# Patient Record
Sex: Male | Born: 1970 | Race: Black or African American | Hispanic: No | Smoking: Current every day smoker
Health system: Southern US, Community
[De-identification: ages and names within clinical notes are randomized; demographics above are authoritative.]

## PROBLEM LIST (undated history)

## (undated) DIAGNOSIS — I10 Essential (primary) hypertension: Secondary | ICD-10-CM

## (undated) DIAGNOSIS — Z8489 Family history of other specified conditions: Secondary | ICD-10-CM

## (undated) DIAGNOSIS — K219 Gastro-esophageal reflux disease without esophagitis: Secondary | ICD-10-CM

## (undated) HISTORY — PX: APPENDECTOMY: SHX54

---

## 2019-05-25 ENCOUNTER — Emergency Department (HOSPITAL_COMMUNITY)
Admission: EM | Admit: 2019-05-25 | Discharge: 2019-05-25 | Disposition: A | Discharge status: Home / Self Care | Attending: Emergency Medicine | Admitting: Emergency Medicine

## 2019-05-25 ENCOUNTER — Emergency Department (HOSPITAL_COMMUNITY)

## 2019-05-25 ENCOUNTER — Other Ambulatory Visit: Payer: Self-pay

## 2019-05-25 ENCOUNTER — Encounter (HOSPITAL_COMMUNITY): Payer: Self-pay | Admitting: Emergency Medicine

## 2019-05-25 DIAGNOSIS — R519 Headache, unspecified: Secondary | ICD-10-CM | POA: Insufficient documentation

## 2019-05-25 DIAGNOSIS — F17210 Nicotine dependence, cigarettes, uncomplicated: Secondary | ICD-10-CM | POA: Diagnosis not present

## 2019-05-25 DIAGNOSIS — I10 Essential (primary) hypertension: Secondary | ICD-10-CM | POA: Diagnosis not present

## 2019-05-25 HISTORY — DX: Essential (primary) hypertension: I10

## 2019-05-25 HISTORY — DX: Gastro-esophageal reflux disease without esophagitis: K21.9

## 2019-05-25 LAB — CBC WITH DIFFERENTIAL/PLATELET
Abs Immature Granulocytes: 0 10*3/uL (ref 0.00–0.07)
Basophils Absolute: 0 10*3/uL (ref 0.0–0.1)
Basophils Relative: 0 %
Eosinophils Absolute: 0.1 10*3/uL (ref 0.0–0.5)
Eosinophils Relative: 2 %
HCT: 42.8 % (ref 39.0–52.0)
Hemoglobin: 13.8 g/dL (ref 13.0–17.0)
Immature Granulocytes: 0 %
Lymphocytes Relative: 52 %
Lymphs Abs: 2.8 10*3/uL (ref 0.7–4.0)
MCH: 27.7 pg (ref 26.0–34.0)
MCHC: 32.2 g/dL (ref 30.0–36.0)
MCV: 85.8 fL (ref 80.0–100.0)
Monocytes Absolute: 0.4 10*3/uL (ref 0.1–1.0)
Monocytes Relative: 8 %
Neutro Abs: 2.1 10*3/uL (ref 1.7–7.7)
Neutrophils Relative %: 38 %
Platelets: 231 10*3/uL (ref 150–400)
RBC: 4.99 MIL/uL (ref 4.22–5.81)
RDW: 14.6 % (ref 11.5–15.5)
WBC: 5.4 10*3/uL (ref 4.0–10.5)
nRBC: 0 % (ref 0.0–0.2)

## 2019-05-25 LAB — TROPONIN I (HIGH SENSITIVITY)
Troponin I (High Sensitivity): 3 ng/L (ref <18)
Troponin I (High Sensitivity): 4 ng/L (ref <18)

## 2019-05-25 LAB — BASIC METABOLIC PANEL
Anion gap: 10 (ref 5–15)
BUN: 17 mg/dL (ref 6–20)
CO2: 27 mmol/L (ref 22–32)
Calcium: 9.1 mg/dL (ref 8.9–10.3)
Chloride: 101 mmol/L (ref 98–111)
Creatinine, Ser: 0.87 mg/dL (ref 0.61–1.24)
GFR calc Af Amer: >60 mL/min (ref >60)
GFR calc non Af Amer: >60 mL/min (ref >60)
Glucose, Bld: 91 mg/dL (ref 70–99)
Potassium: 4 mmol/L (ref 3.5–5.1)
Sodium: 138 mmol/L (ref 135–145)

## 2019-05-25 NOTE — ED Provider Notes (Signed)
Pt signed out to me from [facility], PA-c pending CT imaging and visual acuity.  Pt with chest pain 2 days ago, migrating headache, first at left temple, now at the right occipital, mild currently, transient blurred vision right eye, improved.  Diagnosed with htn since incarceration, on lisinopril, hctz and nifedipine with persistent elevation in bp's.  Nearly sx free here,  Labs, imaging stable, no evidence of cva, delta troponins negative.  No headache pattern suggesting temporal arteritis.  No exam findings to suggest cva.  Visual acuity 2/20 OS/OD/OU  Advised close f/u with primary provider for consideration of bp medication adjustment.    The patient appears reasonably screened and/or stabilized for discharge and I doubt any other medical condition or other [facility] requiring further screening, evaluation, or treatment in the ED at this time prior to discharge.  Results for orders placed or performed during the hospital encounter of 05/25/19  CBC with Differential  Result Value Ref Range   WBC 5.4 4.0 - 10.5 K/uL   RBC 4.99 4.22 - 5.81 MIL/uL   Hemoglobin 13.8 13.0 - 17.0 g/dL   HCT 42.8 39.0 - 52.0 %   MCV 85.8 80.0 - 100.0 fL   MCH 27.7 26.0 - 34.0 pg   MCHC 32.2 30.0 - 36.0 g/dL   RDW 14.6 11.5 - 15.5 %   Platelets 231 150 - 400 K/uL   nRBC 0.0 0.0 - 0.2 %   Neutrophils Relative % 38 %   Neutro Abs 2.1 1.7 - 7.7 K/uL   Lymphocytes Relative 52 %   Lymphs Abs 2.8 0.7 - 4.0 K/uL   Monocytes Relative 8 %   Monocytes Absolute 0.4 0.1 - 1.0 K/uL   Eosinophils Relative 2 %   Eosinophils Absolute 0.1 0.0 - 0.5 K/uL   Basophils Relative 0 %   Basophils Absolute 0.0 0.0 - 0.1 K/uL   Immature Granulocytes 0 %   Abs Immature Granulocytes 0.00 0.00 - 0.07 K/uL  Basic metabolic panel  Result Value Ref Range   Sodium 138 135 - 145 mmol/L   Potassium 4.0 3.5 - 5.1 mmol/L   Chloride 101 98 - 111 mmol/L   CO2 27 22 - 32 mmol/L   Glucose, Bld 91 70 - 99 mg/dL   BUN 17 6 - 20 mg/dL   Creatinine, Ser 0.87 0.61 - 1.24 mg/dL   Calcium 9.1 8.9 - 10.3 mg/dL   GFR calc non Af Amer >60 >60 mL/min   GFR calc Af Amer >60 >60 mL/min   Anion gap 10 5 - 15  Troponin I (High Sensitivity)  Result Value Ref Range   Troponin I (High Sensitivity) 3 <18 ng/L  Troponin I (High Sensitivity)  Result Value Ref Range   Troponin I (High Sensitivity) 4 <18 ng/L   CT HEAD WO CONTRAST  Result Date: 05/25/2019 CLINICAL DATA:  Headache, episode of blurry vision EXAM: CT HEAD WITHOUT CONTRAST TECHNIQUE: Contiguous axial images were obtained from the base of the skull through the vertex without intravenous contrast. COMPARISON:  None. FINDINGS: Brain: There is no acute intracranial hemorrhage, mass-effect, or edema. Gray-white differentiation is preserved. A small focal area of hypoattenuation at the left aspect of the pons is probably artifactual. There is no extra-axial fluid collection. Ventricles and sulci are within normal limits in size and configuration. Vascular: No hyperdense vessel or unexpected calcification. Skull: Calvarium is unremarkable. Sinuses/Orbits: No acute finding. Other: None. IMPRESSION: No acute intracranial hemorrhage, mass effect, or evidence of acute infarction. Electronically Signed  By: Macy Mis M.D.   On: 05/25/2019 16:41   DG Chest Portable 1 View  Result Date: 05/25/2019 CLINICAL DATA:  Chest pain EXAM: PORTABLE CHEST 1 VIEW COMPARISON:  None. FINDINGS: There is no edema or consolidation. Heart is upper normal in size with pulmonary vascularity normal. No adenopathy. No bone lesions. No pneumothorax. IMPRESSION: No edema or consolidation. Heart upper normal in size. No adenopathy evident. Electronically Signed   By: Lowella Grip III M.D.   On: 05/25/2019 16:09       Landis Martins 05/25/19 1807    Noemi Chapel, MD 05/26/19 1021

## 2019-05-25 NOTE — ED Provider Notes (Signed)
[facility] EMERGENCY DEPARTMENT Provider Note   CSN: [id number] Arrival date & time: 05/25/19  1116     History Chief Complaint  Patient presents with  . Headache    Kyle Reed is a 48 y.o. male.  HPI      Kyle Reed is a 48 y.o. male who is an inmate at a local correctional facility,  presents to the Emergency Department complaining of left chest pain two days ago and left sided headache for one day.  He states that he occasionally has chest pain associated with belching that is typically relieved after taking antacids.  This episode did not improve after taking Prilosec.  Yesterday, he reports a gradual onset of throbbing pain to his left temple area and associated with blurred vision in his right eye.  He denies having any facial weakness, changes in speech, numbness or weakness of his extremities and dizziness.  He does endorse drooling and increased salivation for several days.  He reports a known history of hypertension, but states he has not been on any medication for his blood pressure prior to his incarceration.  Since then, he has been taking 25 mg hydrochlorothiazide, 10 mg lisinopril, 20 mg nifedipine.  Patient was also noted to have a blood pressure of 160/108 this morning at 8 AM.  He was given clonidine at the facility and sent to ER for further evaluation.   Past Medical History:  Diagnosis Date  . Acid reflux   . Hypertension     There are no problems to display for this patient.    No family history on file.  Social History   Tobacco Use  . Smoking status: Current Some Day Smoker  Substance Use Topics  . Alcohol use: Not Currently  . Drug use: Not on file    Home Medications Prior to Admission medications   Not on File    Allergies    Patient has no allergy information on record.  Review of Systems   Review of Systems  Constitutional: Negative for activity change, appetite change and fever.  HENT: Positive for drooling. Negative for  facial swelling, sore throat and trouble swallowing.   Eyes: Positive for visual disturbance. Negative for photophobia and pain.  Respiratory: Negative for cough and shortness of breath.   Cardiovascular: Positive for chest pain.  Gastrointestinal: Negative for abdominal pain, nausea and vomiting.  Genitourinary: Negative for dysuria and flank pain.  Musculoskeletal: Negative for neck pain and neck stiffness.  Skin: Negative for rash and wound.  Neurological: Positive for headaches. Negative for dizziness, syncope, facial asymmetry, speech difficulty, weakness and numbness.  Psychiatric/Behavioral: Negative for confusion and decreased concentration.    Physical Exam Updated Vital Signs BP (!) 133/102 (BP Location: Right Arm)   Pulse 79   Temp 97.9 F (36.6 C) (Oral)   Resp 18   Ht 5\' 9"  (1.753 m)   Wt 93 kg   SpO2 98%   BMI 30.27 kg/m   Physical Exam Vitals and nursing note reviewed.  Constitutional:      General: He is not in acute distress.    Appearance: He is well-developed. He is not ill-appearing.  HENT:     Head: Normocephalic.     Mouth/Throat:     Mouth: Mucous membranes are moist.     Pharynx: Oropharynx is clear.  Eyes:     Extraocular Movements: Extraocular movements intact.     Conjunctiva/sclera: Conjunctivae normal.     Pupils: Pupils are equal, round, and reactive  to light.  Neck:     Trachea: Phonation normal.     Meningeal: Kernig's sign absent.  Cardiovascular:     Rate and Rhythm: Normal rate and regular rhythm.     Pulses: Normal pulses.  Pulmonary:     Effort: Pulmonary effort is normal. No respiratory distress.     Breath sounds: Normal breath sounds.  Musculoskeletal:        General: Normal range of motion.     Cervical back: Normal range of motion and neck supple. No rigidity. No spinous process tenderness or muscular tenderness.     Right lower leg: No edema.     Left lower leg: No edema.  Skin:    General: Skin is warm.     Capillary  Refill: Capillary refill takes less than 2 seconds.     Findings: No rash.  Neurological:     General: No focal deficit present.     Mental Status: He is alert and oriented to person, place, and time.     GCS: GCS eye subscore is 4. GCS verbal subscore is 5. GCS motor subscore is 6.     Cranial Nerves: No cranial nerve deficit.     Sensory: Sensation is intact. No sensory deficit.     Motor: Motor function is intact. No abnormal muscle tone.     Coordination: Coordination is intact. Coordination normal.     Gait: Gait is intact. Gait normal.     Comments: CN II-XII intact.  No pronator drift, no dysarthria, no facial droop.  Patient ambulated into the room with steady gait.  Psychiatric:        Thought Content: Thought content normal.     ED Results / Procedures / Treatments   Labs (all labs ordered are listed, but only abnormal results are displayed) Labs Reviewed  CBC WITH DIFFERENTIAL/PLATELET  BASIC METABOLIC PANEL  TROPONIN I (HIGH SENSITIVITY)  TROPONIN I (HIGH SENSITIVITY)    EKG EKG Interpretation  Date/Time:  Tuesday May 25 2019 13:11:00 EST Ventricular Rate:  72 PR Interval:  128 QRS Duration: 92 QT Interval:  384 QTC Calculation: 420 R Axis:   -36 Text Interpretation: Normal sinus rhythm Left axis deviation Abnormal ECG No old tracing to compare Confirmed by Aletta Edouard [phone]) on 05/25/2019 1:19:37 PM   Radiology No results found.  Procedures Procedures (including critical care time)  Medications Ordered in ED Medications - No data to display  ED Course  I have reviewed the triage vital signs and the nursing notes.  Pertinent labs & imaging results that were available during my care of the patient were reviewed by me and considered in my medical decision making (see chart for details).    MDM Rules/Calculators/A&P                      Pt with chest pain and headache of gradual onset.  Sx's have been associated with blurred vision on right  and "drooling"  His neurological exam is reassuring and he ambulatory with steady gait. Doubt CVA/TIA. He is hypertensive here and was treated this morning with clonidine.   Will obtain labs, imaging and EKG.   Work up is pending, discussed with Evalee Jefferson, PA-C at end of shift.     Final Clinical Impression(s) / ED Diagnoses Final diagnoses:  None    Rx / DC Orders ED Discharge Orders    None       Kem Parkinson, PA-C 05/25/19 1654  Noemi Chapel, MD 05/26/19 1022

## 2019-05-25 NOTE — ED Triage Notes (Signed)
Pt reports headache for last several days. Pt reports episode of chest pain x2 days ago, pt reports episode of blurred vision yesterday, and intermittent "drolling" last several days. Pt alert and oriented. Facial symmetry noted. Pt denies numbness/tingling, chest pain at this time.airway patent.

## 2019-05-25 NOTE — Discharge Instructions (Signed)
Your lab tests, CT imaging, ekg and chest xray are all reassuring today for no evidence of stroke or other complications from your elevated blood pressure.

## 2021-06-20 ENCOUNTER — Other Ambulatory Visit (HOSPITAL_COMMUNITY): Payer: Self-pay | Admitting: Physician Assistant

## 2021-06-20 ENCOUNTER — Other Ambulatory Visit: Payer: Self-pay | Admitting: Physician Assistant

## 2021-06-20 DIAGNOSIS — R109 Unspecified abdominal pain: Secondary | ICD-10-CM

## 2021-07-31 ENCOUNTER — Other Ambulatory Visit: Payer: Self-pay

## 2021-07-31 ENCOUNTER — Ambulatory Visit (HOSPITAL_COMMUNITY)
Admission: RE | Admit: 2021-07-31 | Discharge: 2021-07-31 | Disposition: A | Discharge status: Home / Self Care | Payer: 59 | Source: Ambulatory Visit | Attending: Physician Assistant | Admitting: Physician Assistant

## 2021-07-31 DIAGNOSIS — R109 Unspecified abdominal pain: Secondary | ICD-10-CM | POA: Insufficient documentation

## 2021-08-13 ENCOUNTER — Encounter: Payer: Self-pay | Admitting: Internal Medicine

## 2021-08-20 ENCOUNTER — Other Ambulatory Visit: Payer: Self-pay | Admitting: Family Medicine

## 2021-08-20 ENCOUNTER — Other Ambulatory Visit (HOSPITAL_COMMUNITY): Payer: Self-pay | Admitting: Family Medicine

## 2021-08-20 DIAGNOSIS — D7389 Other diseases of spleen: Secondary | ICD-10-CM

## 2021-08-20 DIAGNOSIS — R911 Solitary pulmonary nodule: Secondary | ICD-10-CM

## 2021-08-24 DIAGNOSIS — E559 Vitamin D deficiency, unspecified: Secondary | ICD-10-CM | POA: Diagnosis not present

## 2021-08-24 DIAGNOSIS — R109 Unspecified abdominal pain: Secondary | ICD-10-CM | POA: Diagnosis not present

## 2021-08-24 DIAGNOSIS — R69 Illness, unspecified: Secondary | ICD-10-CM | POA: Diagnosis not present

## 2021-08-24 DIAGNOSIS — I10 Essential (primary) hypertension: Secondary | ICD-10-CM | POA: Diagnosis not present

## 2021-08-24 DIAGNOSIS — R748 Abnormal levels of other serum enzymes: Secondary | ICD-10-CM | POA: Diagnosis not present

## 2021-08-24 DIAGNOSIS — K219 Gastro-esophageal reflux disease without esophagitis: Secondary | ICD-10-CM | POA: Diagnosis not present

## 2021-08-24 DIAGNOSIS — R911 Solitary pulmonary nodule: Secondary | ICD-10-CM | POA: Diagnosis not present

## 2021-08-29 ENCOUNTER — Ambulatory Visit (HOSPITAL_COMMUNITY)
Admission: RE | Admit: 2021-08-29 | Discharge: 2021-08-29 | Disposition: A | Discharge status: Home / Self Care | Payer: 59 | Source: Ambulatory Visit | Attending: Family Medicine | Admitting: Family Medicine

## 2021-08-29 DIAGNOSIS — D7389 Other diseases of spleen: Secondary | ICD-10-CM | POA: Insufficient documentation

## 2021-08-29 DIAGNOSIS — D734 Cyst of spleen: Secondary | ICD-10-CM | POA: Diagnosis not present

## 2021-09-14 ENCOUNTER — Ambulatory Visit (HOSPITAL_COMMUNITY)
Admission: RE | Admit: 2021-09-14 | Discharge: 2021-09-14 | Disposition: A | Discharge status: Home / Self Care | Payer: 59 | Source: Ambulatory Visit | Attending: Family Medicine | Admitting: Family Medicine

## 2021-09-14 DIAGNOSIS — R911 Solitary pulmonary nodule: Secondary | ICD-10-CM | POA: Diagnosis not present

## 2021-09-20 ENCOUNTER — Telehealth: Payer: Self-pay

## 2021-09-20 ENCOUNTER — Ambulatory Visit (INDEPENDENT_AMBULATORY_CARE_PROVIDER_SITE_OTHER): Payer: 59 | Admitting: Internal Medicine

## 2021-09-20 ENCOUNTER — Inpatient Hospital Stay (HOSPITAL_COMMUNITY): Payer: 59 | Admitting: Hematology

## 2021-09-20 ENCOUNTER — Other Ambulatory Visit: Payer: Self-pay

## 2021-09-20 ENCOUNTER — Encounter: Payer: Self-pay | Admitting: Internal Medicine

## 2021-09-20 VITALS — BP 110/70 | HR 84 | Temp 97.0°F | Ht 68.0 in | Wt 207.2 lb

## 2021-09-20 DIAGNOSIS — Z1211 Encounter for screening for malignant neoplasm of colon: Secondary | ICD-10-CM | POA: Diagnosis not present

## 2021-09-20 DIAGNOSIS — D7389 Other diseases of spleen: Secondary | ICD-10-CM | POA: Diagnosis not present

## 2021-09-20 DIAGNOSIS — R1012 Left upper quadrant pain: Secondary | ICD-10-CM | POA: Diagnosis not present

## 2021-09-20 DIAGNOSIS — K5900 Constipation, unspecified: Secondary | ICD-10-CM | POA: Diagnosis not present

## 2021-09-20 DIAGNOSIS — K219 Gastro-esophageal reflux disease without esophagitis: Secondary | ICD-10-CM

## 2021-09-20 DIAGNOSIS — R911 Solitary pulmonary nodule: Secondary | ICD-10-CM | POA: Insufficient documentation

## 2021-09-20 MED ORDER — PEG 3350-KCL-NA BICARB-NACL 420 G PO SOLR: 4000.0000 mL | ORAL | 0 refills | Status: DC

## 2021-09-20 NOTE — Progress Notes (Shared)
? ?[facility] ?[address] ?[place], [place] [address] ? ? ?CLINIC:  ?Medical Oncology/Hematology ? ?CONSULT NOTE ? ?Patient Care Team: ?Bucio, [place] Dragon, FNP as PCP - General (Family Medicine) ?Eloise Harman, DO as Consulting Physician (Gastroenterology) ? ?CHIEF COMPLAINTS/PURPOSE OF CONSULTATION:  ?Evaluation of right middle lobe lung nodule and splenic lesion ? ?HISTORY OF PRESENTING ILLNESS:  ?Mr. Markel Kurtenbach 51 y.o. male is here because of right middle lobe lung nodule, at the request of [facility]. ? ?*** ? ?MEDICAL HISTORY:  ?Past Medical History:  ?Diagnosis Date  ? Acid reflux   ? Hypertension   ? ? ?SURGICAL HISTORY: ?*** The histories are not reviewed yet. Please review them in the "History" navigator section and refresh this [place]. ? ?SOCIAL HISTORY: ?Social History  ? ?Socioeconomic History  ? Marital status: Legally Separated  ?  Spouse name: Not on file  ? Number of children: Not on file  ? Years of education: Not on file  ? Highest education level: Not on file  ?Occupational History  ? Not on file  ?Tobacco Use  ? Smoking status: Some Days  ? Smokeless tobacco: Not on file  ?Substance and Sexual Activity  ? Alcohol use: Not Currently  ? Drug use: Not on file  ? Sexual activity: Not on file  ?Other Topics Concern  ? Not on file  ?Social History Narrative  ? Not on file  ? ?Social Determinants of Health  ? ?Financial Resource Strain: Not on file  ?Food Insecurity: Not on file  ?Transportation Needs: Not on file  ?Physical Activity: Not on file  ?Stress: Not on file  ?Social Connections: Not on file  ?Intimate Partner Violence: Not on file  ? ? ?FAMILY HISTORY: ?No family history on file. ? ?ALLERGIES:  ?has no allergies on file. ? ?MEDICATIONS:  ?No current outpatient medications on file.  ? ?No current facility-administered medications for this visit.  ? ? ?REVIEW OF SYSTEMS:   ?Review of Systems - Oncology ? ? ?PHYSICAL EXAMINATION: ?ECOG PERFORMANCE STATUS: {CHL  ONC ECOG QT:[id number]} ? ?There were no vitals filed for this visit. ?There were no vitals filed for this visit. ?Physical Exam ? ? ?LABORATORY DATA:  ?I have reviewed the data as listed ? ?  Latest Ref Rng & Units 05/25/2019  ?  2:25 PM  ?CBC  ?WBC 4.0 - 10.5 K/uL 5.4    ?Hemoglobin 13.0 - 17.0 g/dL 13.8    ?Hematocrit 39.0 - 52.0 % 42.8    ?Platelets 150 - 400 K/uL 231    ? ? ?  Latest Ref Rng & Units 05/25/2019  ?  2:25 PM  ?CMP  ?Glucose 70 - 99 mg/dL 91    ?BUN 6 - 20 mg/dL 17    ?Creatinine 0.61 - 1.24 mg/dL 0.87    ?Sodium 135 - 145 mmol/L 138    ?Potassium 3.5 - 5.1 mmol/L 4.0    ?Chloride 98 - 111 mmol/L 101    ?CO2 22 - 32 mmol/L 27    ?Calcium 8.9 - 10.3 mg/dL 9.1    ? ? ?RADIOGRAPHIC STUDIES: ?I have personally reviewed the radiological images as listed and agreed with the findings in the report. ?CT CHEST WO CONTRAST ? ?Result Date: 09/17/2021 ?CLINICAL DATA:  Follow-up pulmonary nodule seen on CT of the abdomen pelvis dated 07/31/2021. EXAM: CT CHEST WITHOUT CONTRAST TECHNIQUE: Multidetector CT imaging of the chest was performed following the standard protocol without IV contrast. RADIATION DOSE REDUCTION:  This exam was performed according to the departmental dose-optimization program which includes automated exposure control, adjustment of the mA and/or kV according to patient size and/or use of iterative reconstruction technique. COMPARISON:  CT abdomen pelvis dated 07/31/2021 and chest CT dated 09/24/2020. FINDINGS: Evaluation of this exam is limited in the absence of intravenous contrast. Cardiovascular: There is no cardiomegaly or pericardial effusion. There is coronary vascular calcification involving the left circumflex artery. The thoracic aorta and central pulmonary arteries are grossly unremarkable on this noncontrast CT. Mediastinum/Nodes: No hilar or mediastinal adenopathy. The esophagus and the thyroid gland are grossly unremarkable. No mediastinal fluid collection. Lungs/Pleura: No  significant interval change in the 15 mm subpleural nodule in the right middle lobe dating back to 09/24/2020. No focal consolidation, pleural effusion, or pneumothorax. The central airways are patent. Upper Abdomen: A 3.5 cm hypodense lesion within the spleen has increased in size since 09/24/2020. This is suboptimally characterized but likely represents a cyst or hemangioma. Ultrasound or MRI may provide better characterization. Musculoskeletal: No chest wall mass or suspicious bone lesions identified. IMPRESSION: 1. No acute intrathoracic pathology. 2. No significant interval change in the 15 mm subpleural nodule in the right middle lobe dating back to 09/24/2020. Further evaluation with PET-CT or follow-up with CT in 6-12 months recommended. 3. Coronary vascular calcification involving the left circumflex artery. Electronically Signed   By: Anner Crete M.D.   On: 09/17/2021 14:37  ? ?US SPLEEN (ABDOMEN LIMITED) ? ?Result Date: 08/30/2021 ?CLINICAL DATA:  Hypodense splenic lesion on CT. EXAM: ULTRASOUND ABDOMEN LIMITED COMPARISON:  The CT without contrast 07/31/2021, CTA chest, abdomen and pelvis 09/24/2020 FINDINGS: Targeted ultrasound utilizing an abdominal curved transducer was performed with attention to the spleen only. Spleen measures 7.5 x 10.1 x 4.8 cm for a volume of 189.35 cm 3. There is a thinly septated cystic lesion corresponding to the CT finding, with no visible mural nodularity or internal color flow and with layering hypoechoic debris in the posterior cavity. The cystic lesion measures 4.3 x 3.7 x 4.1 cm, similar in size to the 07/31/2021 exam but has enlarged compared with 09/24/2020, when this measured no more than 1.4 cm in diameter. IMPRESSION: Septated splenic cystic lesion, more than doubled in size since 09/24/2020. No internal color flow or mural nodularity is seen. Clinical significance indeterminate. Electronically Signed   By: Telford Nab M.D.   On: 08/30/2021 00:51    ? ?ASSESSMENT:  ?Right middle lobe lung nodule ? ? ?Splenic lesion ? ? ?Social/family history ? ? ?PLAN:  ?Right middle lobe lung nodule ? ? ?Splenic lesion ? ? ?All questions were answered. The patient knows to call the clinic with any problems, questions or concerns. ? ? ?Derek Jack, MD, 09/20/21 8:04 AM  ?[facility] ?[phone] ? ? ?I, Thana Ates, am acting as a scribe for Dr. Derek Jack. ? ?{Add Scribe Attestation Statement}  ? ? ?

## 2021-09-20 NOTE — Progress Notes (Addendum)
? ? ?Primary Care Physician:  Olga Coaster, FNP ?Primary Gastroenterologist:  Dr. Abbey Chatters ? ?Chief Complaint  ?Patient presents with  ? New Patient (Initial Visit)  ?  TCS and left abd pain  ? ? ?HPI:   ?Kyle Reed is a 51 y.o. male who presents to clinic today by referral from his PCP also Bucio for evaluation.  Multiple GI complaints. ? ?Has chronic GERD which is well controlled on omeprazole 40 mg daily.  Denies any dysphagia/odynophagia, epigastric or chest pain.  No melena hematochezia. ? ?Never undergone colonoscopy before.  Wishes to schedule screening colonoscopy today.  No family history of colorectal malignancy though he does note he is unaware of his father's past medical history.  No unintentional weight loss. ? ?Does note left upper quadrant abdominal pain x1 year.  Constant, mild to moderate in severity, does not radiate.  Started on Flexeril which she states helps some. ? ?CT abdomen pelvis without contrast 07/31/2021 showed 13 mm right middle lobe pulmonary nodule and 2.7 cm l cyst in the spleen.  Follow-up ultrasound of spleen 08/29/2021 showed cystic lesion measuring 4.3 x 3.7 x 4.1 cm in the spleen. ? ?He is seeing Dr. Raliegh Ip next week in regards to these lesions. ? ?Patient also with chronic constipation.  States he has bowel movements 1-2 times daily though these are largely incomplete.  Did have a CT that showed moderate stool burden previously.  No straining.  No rectal bleeding.  No rectal discomfort. ? ?Past Medical History:  ?Diagnosis Date  ? Acid reflux   ? Hypertension   ? ? ?Past Surgical History:  ?Procedure Laterality Date  ? APPENDECTOMY    ? ? ?Current Outpatient Medications  ?Medication Sig Dispense Refill  ? amLODipine (NORVASC) 10 MG tablet Take 10 mg by mouth daily.    ? cyclobenzaprine (FLEXERIL) 10 MG tablet Take 10 mg by mouth 3 (three) times daily as needed for muscle spasms.    ? famotidine (PEPCID) 20 MG tablet Take 20 mg by mouth 2 (two) times daily.    ? hydrOXYzine (ATARAX)  25 MG tablet Take 25 mg by mouth 3 (three) times daily as needed.    ? ibuprofen (ADVIL) 800 MG tablet Take 800 mg by mouth every 8 (eight) hours as needed.    ? losartan-hydrochlorothiazide (HYZAAR) 100-12.5 MG tablet Take 1 tablet by mouth daily.    ? omeprazole (PRILOSEC) 40 MG capsule Take 40 mg by mouth daily.    ? traMADol (ULTRAM) 50 MG tablet Take by mouth every 6 (six) hours as needed.    ? ?No current facility-administered medications for this visit.  ? ? ?Allergies as of 09/20/2021  ? (Not on File)  ? ? ?History reviewed. No pertinent family history. ? ?Social History  ? ?Socioeconomic History  ? Marital status: Legally Separated  ?  Spouse name: Not on file  ? Number of children: Not on file  ? Years of education: Not on file  ? Highest education level: Not on file  ?Occupational History  ? Not on file  ?Tobacco Use  ? Smoking status: Every Day  ?  Packs/day: 0.50  ?  Types: Cigarettes  ? Smokeless tobacco: Not on file  ?Substance and Sexual Activity  ? Alcohol use: Not Currently  ?  Comment: a beer every now and then  ? Drug use: Yes  ?  Types: Marijuana  ? Sexual activity: Not on file  ?Other Topics Concern  ? Not on file  ?Social  History Narrative  ? Not on file  ? ?Social Determinants of Health  ? ?Financial Resource Strain: Not on file  ?Food Insecurity: Not on file  ?Transportation Needs: Not on file  ?Physical Activity: Not on file  ?Stress: Not on file  ?Social Connections: Not on file  ?Intimate Partner Violence: Not on file  ? ? ?Subjective: ?Review of Systems  ?Constitutional:  Negative for chills and fever.  ?HENT:  Negative for congestion and hearing loss.   ?Eyes:  Negative for blurred vision and double vision.  ?Respiratory:  Negative for cough and shortness of breath.   ?Cardiovascular:  Negative for chest pain and palpitations.  ?Gastrointestinal:  Positive for abdominal pain, constipation and heartburn. Negative for blood in stool, diarrhea, melena and vomiting.  ?Genitourinary:   Negative for dysuria and urgency.  ?Musculoskeletal:  Negative for joint pain and myalgias.  ?Skin:  Negative for itching and rash.  ?Neurological:  Negative for dizziness and headaches.  ?Psychiatric/Behavioral:  Negative for depression. The patient is not nervous/anxious.    ? ? ? ?Objective: ?BP 110/70   Pulse 84   Temp (!) 97 ?F (36.1 ?C)   Ht '5\' 8"'$  (1.727 m)   Wt 207 lb 3.2 oz (94 kg)   BMI 31.50 kg/m?  ?Physical Exam ?Constitutional:   ?   Appearance: Normal appearance.  ?HENT:  ?   Head: Normocephalic and atraumatic.  ?Eyes:  ?   Extraocular Movements: Extraocular movements intact.  ?   Conjunctiva/sclera: Conjunctivae normal.  ?Cardiovascular:  ?   Rate and Rhythm: Normal rate and regular rhythm.  ?Pulmonary:  ?   Effort: Pulmonary effort is normal.  ?   Breath sounds: Normal breath sounds.  ?Abdominal:  ?   General: Bowel sounds are normal.  ?   Palpations: Abdomen is soft.  ?   Tenderness: There is abdominal tenderness.  ?Musculoskeletal:     ?   General: Normal range of motion.  ?   Cervical back: Normal range of motion and neck supple.  ?Skin: ?   General: Skin is warm.  ?Neurological:  ?   General: No focal deficit present.  ?   Mental Status: He is alert and oriented to person, place, and time.  ?Psychiatric:     ?   Mood and Affect: Mood normal.     ?   Behavior: Behavior normal.  ? ? ? ?Assessment: ?*Chronic GERD-well-controlled on omeprazole daily ?*Left upper quadrant pain ?*Splenic cyst-enlarging ?*Chronic constipation ?*Colon cancer screening ? ?Plan: ?GERD well-controlled on omeprazole daily.  We will continue.  No alarm features today to warrant further investigation with EGD. ? ?Etiology of left upper quadrant pain unclear, possibly musculoskeletal.  States Flexeril is helping.  Does have an enlarging splenic cyst.  Seeing Dr. Raliegh Ip of oncology next week in regards to this as well as pulmonary nodule. ? ?For patient's constipation, I recommended taking MiraLAX 1 capful daily.  If this is not  adequate then increase to 2 capfuls daily.  If this is still not adequate then add on once daily Dulcolax. ? ?Also recommended patient increase soluble fiber.  Encouraged to drink at least 6 glasses of water daily. ? ?Will schedule for screening colonoscopy.The risks including infection, bleed, or perforation as well as benefits, limitations, alternatives and imponderables have been reviewed with the patient. Questions have been answered. All parties agreeable. ? ?Thank you Leandro Reasoner Bucio for the kind referral ? ? ?09/20/2021 11:02 AM ? ? ?Disclaimer: This note was dictated with voice  recognition software. Similar sounding words can inadvertently be transcribed and may not be corrected upon review. ? ?

## 2021-09-20 NOTE — Telephone Encounter (Signed)
TCS instructions were given to pt this morning at [facility]. ?

## 2021-09-20 NOTE — H&P (View-Only) (Signed)
? ? ?Primary Care Physician:  Olga Coaster, FNP ?Primary Gastroenterologist:  Dr. Abbey Chatters ? ?Chief Complaint  ?Patient presents with  ? New Patient (Initial Visit)  ?  TCS and left abd pain  ? ? ?HPI:   ?Kyle Reed is a 51 y.o. male who presents to clinic today by referral from his PCP also [facility] for evaluation.  Multiple GI complaints. ? ?Has chronic GERD which is well controlled on omeprazole 40 mg daily.  Denies any dysphagia/odynophagia, epigastric or chest pain.  No melena hematochezia. ? ?Never undergone colonoscopy before.  Wishes to schedule screening colonoscopy today.  No family history of colorectal malignancy though he does note he is unaware of his father's past medical history.  No unintentional weight loss. ? ?Does note left upper quadrant abdominal pain x1 year.  Constant, mild to moderate in severity, does not radiate.  Started on Flexeril which she states helps some. ? ?CT abdomen pelvis without contrast 07/31/2021 showed 13 mm right middle lobe pulmonary nodule and 2.7 cm l cyst in the spleen.  Follow-up ultrasound of spleen 08/29/2021 showed cystic lesion measuring 4.3 x 3.7 x 4.1 cm in the spleen. ? ?He is seeing Dr. Raliegh Ip next week in regards to these lesions. ? ?Patient also with chronic constipation.  States he has bowel movements 1-2 times daily though these are largely incomplete.  Did have a CT that showed moderate stool burden previously.  No straining.  No rectal bleeding.  No rectal discomfort. ? ?Past Medical History:  ?Diagnosis Date  ? Acid reflux   ? Hypertension   ? ? ?Past Surgical History:  ?Procedure Laterality Date  ? APPENDECTOMY    ? ? ?Current Outpatient Medications  ?Medication Sig Dispense Refill  ? amLODipine (NORVASC) 10 MG tablet Take 10 mg by mouth daily.    ? cyclobenzaprine (FLEXERIL) 10 MG tablet Take 10 mg by mouth 3 (three) times daily as needed for muscle spasms.    ? famotidine (PEPCID) 20 MG tablet Take 20 mg by mouth 2 (two) times daily.    ? hydrOXYzine (ATARAX)  25 MG tablet Take 25 mg by mouth 3 (three) times daily as needed.    ? ibuprofen (ADVIL) 800 MG tablet Take 800 mg by mouth every 8 (eight) hours as needed.    ? losartan-hydrochlorothiazide (HYZAAR) 100-12.5 MG tablet Take 1 tablet by mouth daily.    ? omeprazole (PRILOSEC) 40 MG capsule Take 40 mg by mouth daily.    ? traMADol (ULTRAM) 50 MG tablet Take by mouth every 6 (six) hours as needed.    ? ?No current facility-administered medications for this visit.  ? ? ?Allergies as of 09/20/2021  ? (Not on File)  ? ? ?History reviewed. No pertinent family history. ? ?Social History  ? ?Socioeconomic History  ? Marital status: Legally Separated  ?  Spouse name: Not on file  ? Number of children: Not on file  ? Years of education: Not on file  ? Highest education level: Not on file  ?Occupational History  ? Not on file  ?Tobacco Use  ? Smoking status: Every Day  ?  Packs/day: 0.50  ?  Types: Cigarettes  ? Smokeless tobacco: Not on file  ?Substance and Sexual Activity  ? Alcohol use: Not Currently  ?  Comment: a beer every now and then  ? Drug use: Yes  ?  Types: Marijuana  ? Sexual activity: Not on file  ?Other Topics Concern  ? Not on file  ?Social  History Narrative  ? Not on file  ? ?Social Determinants of Health  ? ?Financial Resource Strain: Not on file  ?Food Insecurity: Not on file  ?Transportation Needs: Not on file  ?Physical Activity: Not on file  ?Stress: Not on file  ?Social Connections: Not on file  ?Intimate Partner Violence: Not on file  ? ? ?Subjective: ?Review of Systems  ?Constitutional:  Negative for chills and fever.  ?HENT:  Negative for congestion and hearing loss.   ?Eyes:  Negative for blurred vision and double vision.  ?Respiratory:  Negative for cough and shortness of breath.   ?Cardiovascular:  Negative for chest pain and palpitations.  ?Gastrointestinal:  Positive for abdominal pain, constipation and heartburn. Negative for blood in stool, diarrhea, melena and vomiting.  ?Genitourinary:   Negative for dysuria and urgency.  ?Musculoskeletal:  Negative for joint pain and myalgias.  ?Skin:  Negative for itching and rash.  ?Neurological:  Negative for dizziness and headaches.  ?Psychiatric/Behavioral:  Negative for depression. The patient is not nervous/anxious.    ? ? ? ?Objective: ?BP 110/70   Pulse 84   Temp (!) 97 ?F (36.1 ?C)   Ht '5\' 8"'$  (1.727 m)   Wt 207 lb 3.2 oz (94 kg)   BMI 31.50 kg/m?  ?Physical Exam ?Constitutional:   ?   Appearance: Normal appearance.  ?HENT:  ?   Head: Normocephalic and atraumatic.  ?Eyes:  ?   Extraocular Movements: Extraocular movements intact.  ?   Conjunctiva/sclera: Conjunctivae normal.  ?Cardiovascular:  ?   Rate and Rhythm: Normal rate and regular rhythm.  ?Pulmonary:  ?   Effort: Pulmonary effort is normal.  ?   Breath sounds: Normal breath sounds.  ?Abdominal:  ?   General: Bowel sounds are normal.  ?   Palpations: Abdomen is soft.  ?   Tenderness: There is abdominal tenderness.  ?Musculoskeletal:     ?   General: Normal range of motion.  ?   Cervical back: Normal range of motion and neck supple.  ?Skin: ?   General: Skin is warm.  ?Neurological:  ?   General: No focal deficit present.  ?   Mental Status: He is alert and oriented to person, place, and time.  ?Psychiatric:     ?   Mood and Affect: Mood normal.     ?   Behavior: Behavior normal.  ? ? ? ?Assessment: ?*Chronic GERD-well-controlled on omeprazole daily ?*Left upper quadrant pain ?*Splenic cyst-enlarging ?*Chronic constipation ?*Colon cancer screening ? ?Plan: ?GERD well-controlled on omeprazole daily.  We will continue.  No alarm features today to warrant further investigation with EGD. ? ?Etiology of left upper quadrant pain unclear, possibly musculoskeletal.  States Flexeril is helping.  Does have an enlarging splenic cyst.  Seeing Dr. Raliegh Ip of oncology next week in regards to this as well as pulmonary nodule. ? ?For patient's constipation, I recommended taking MiraLAX 1 capful daily.  If this is not  adequate then increase to 2 capfuls daily.  If this is still not adequate then add on once daily Dulcolax. ? ?Also recommended patient increase soluble fiber.  Encouraged to drink at least 6 glasses of water daily. ? ?Will schedule for screening colonoscopy.The risks including infection, bleed, or perforation as well as benefits, limitations, alternatives and imponderables have been reviewed with the patient. Questions have been answered. All parties agreeable. ? ?Thank you Leandro Reasoner Bucio for the kind referral ? ? ?09/20/2021 11:02 AM ? ? ?Disclaimer: This note was dictated with voice  recognition software. Similar sounding words can inadvertently be transcribed and may not be corrected upon review. ? ?

## 2021-09-20 NOTE — Patient Instructions (Signed)
We will schedule you for colonoscopy for colon cancer screening purposes. ? ?For your constipation, I want you to start taking over the counter MiraLAX 1 capful daily.  If this does not adequately control your constipation, I would increase to 2 capfuls daily.  If this is still not adequate, then I would add on once daily Dulcolax (bisacodyl) tablet. ?  ?I also recommend increasing fiber in your diet or adding OTC Benefiber/Metamucil. Be sure to drink at least 4 to 6 glasses of water daily.  ? ?Agree with seeing Dr. Raliegh Ip next week.  Likely will need repeat imaging of lung nodule and splenic cyst in 6 to 12 months.  I will defer to his expertise in this regard. ? ?It was nice meeting you today. ? ?Dr. Abbey Chatters ? ?At [facility] we value your feedback. You may receive a survey about your visit today. Please share your experience as we strive to create trusting relationships with our patients to provide genuine, compassionate, quality care. ? ?We appreciate your understanding and patience as we review any laboratory studies, imaging, and other diagnostic tests that are ordered as we care for you. Our office policy is 5 business days for review of these results, and any emergent or urgent results are addressed in a timely manner for your best interest. If you do not hear from our office in 1 week, please contact us.  ? ?We also encourage the use of MyChart, which contains your medical information for your review as well. If you are not enrolled in this feature, an access code is on this after visit summary for your convenience. Thank you for allowing [place] to be involved in your care. ? ?It was great to see you today!  I hope you have a great rest of your Spring! ? ? ? ?Kyle Reed. Abbey Chatters, D.O. ?Gastroenterology and Hepatology ?[facility] ? ?

## 2021-09-20 NOTE — Telephone Encounter (Signed)
Pre-op appt 09/27/21 at 1:15pm. Tried to call pt, LMOVM to inform him of pre-op appt. Letter mailed also. ?

## 2021-09-26 ENCOUNTER — Inpatient Hospital Stay (HOSPITAL_COMMUNITY): Payer: 59

## 2021-09-26 ENCOUNTER — Inpatient Hospital Stay (HOSPITAL_COMMUNITY): Payer: 59 | Attending: Hematology | Admitting: Hematology

## 2021-09-26 VITALS — BP 145/98 | HR 85 | Temp 98.0°F | Resp 18 | Ht 68.5 in | Wt 210.3 lb

## 2021-09-26 DIAGNOSIS — D7389 Other diseases of spleen: Secondary | ICD-10-CM | POA: Diagnosis not present

## 2021-09-26 DIAGNOSIS — R911 Solitary pulmonary nodule: Secondary | ICD-10-CM | POA: Insufficient documentation

## 2021-09-26 DIAGNOSIS — C7A026 Malignant carcinoid tumor of the rectum: Secondary | ICD-10-CM | POA: Diagnosis not present

## 2021-09-26 DIAGNOSIS — D122 Benign neoplasm of ascending colon: Secondary | ICD-10-CM | POA: Insufficient documentation

## 2021-09-26 DIAGNOSIS — F1290 Cannabis use, unspecified, uncomplicated: Secondary | ICD-10-CM | POA: Diagnosis not present

## 2021-09-26 DIAGNOSIS — Z801 Family history of malignant neoplasm of trachea, bronchus and lung: Secondary | ICD-10-CM | POA: Insufficient documentation

## 2021-09-26 DIAGNOSIS — Z79899 Other long term (current) drug therapy: Secondary | ICD-10-CM | POA: Diagnosis not present

## 2021-09-26 DIAGNOSIS — F17210 Nicotine dependence, cigarettes, uncomplicated: Secondary | ICD-10-CM | POA: Diagnosis not present

## 2021-09-26 LAB — COMPREHENSIVE METABOLIC PANEL
ALT: 15 U/L (ref 0–44)
AST: 15 U/L (ref 15–41)
Albumin: 4.1 g/dL (ref 3.5–5.0)
Alkaline Phosphatase: 91 U/L (ref 38–126)
Anion gap: 8 (ref 5–15)
BUN: 16 mg/dL (ref 6–20)
CO2: 24 mmol/L (ref 22–32)
Calcium: 9.1 mg/dL (ref 8.9–10.3)
Chloride: 105 mmol/L (ref 98–111)
Creatinine, Ser: 0.96 mg/dL (ref 0.61–1.24)
GFR, Estimated: >60 mL/min (ref >60)
Glucose, Bld: 108 mg/dL — ABNORMAL HIGH (ref 70–99)
Potassium: 3.1 mmol/L — ABNORMAL LOW (ref 3.5–5.1)
Sodium: 137 mmol/L (ref 135–145)
Total Bilirubin: 0.7 mg/dL (ref 0.3–1.2)
Total Protein: 7.4 g/dL (ref 6.5–8.1)

## 2021-09-26 LAB — CBC WITH DIFFERENTIAL/PLATELET
Abs Immature Granulocytes: 0.01 10*3/uL (ref 0.00–0.07)
Basophils Absolute: 0 10*3/uL (ref 0.0–0.1)
Basophils Relative: 1 %
Eosinophils Absolute: 0.1 10*3/uL (ref 0.0–0.5)
Eosinophils Relative: 1 %
HCT: 42.7 % (ref 39.0–52.0)
Hemoglobin: 14.7 g/dL (ref 13.0–17.0)
Immature Granulocytes: 0 %
Lymphocytes Relative: 34 %
Lymphs Abs: 1.7 10*3/uL (ref 0.7–4.0)
MCH: 28.7 pg (ref 26.0–34.0)
MCHC: 34.4 g/dL (ref 30.0–36.0)
MCV: 83.4 fL (ref 80.0–100.0)
Monocytes Absolute: 0.5 10*3/uL (ref 0.1–1.0)
Monocytes Relative: 11 %
Neutro Abs: 2.7 10*3/uL (ref 1.7–7.7)
Neutrophils Relative %: 53 %
Platelets: 252 10*3/uL (ref 150–400)
RBC: 5.12 MIL/uL (ref 4.22–5.81)
RDW: 15.5 % (ref 11.5–15.5)
WBC: 5.1 10*3/uL (ref 4.0–10.5)
nRBC: 0 % (ref 0.0–0.2)

## 2021-09-26 LAB — LACTATE DEHYDROGENASE: LDH: 121 U/L (ref 98–192)

## 2021-09-26 NOTE — Patient Instructions (Signed)
? ? ? ? ? ? Kimberly Coye ? 09/26/2021  ?  ? '@PREFPERIOPPHARMACY'$ @ ? ? Your procedure is scheduled on  10/01/2021. ? ? Report to [facility] at  1300   (1:00) P.M. ? ? Call this number if you have problems the morning of surgery: ? [phone] ? ? Remember: ? Follow the diet and prep instructions given to you by the office. ?  ? Take these medicines the morning of surgery with A SIP OF WATER   ? ?    ?   amlodipine, flexeril, pepcid, atarax, prilosec, tramadol. ? ? ?  ? Do not wear jewelry, make-up or nail polish. ? Do not wear lotions, powders, or perfumes, or deodorant. ? Do not shave 48 hours prior to surgery.  Men may shave face and neck. ? Do not bring valuables to the hospital. ? [facility] is not responsible for any belongings or valuables. ? ?Contacts, dentures or bridgework may not be worn into surgery.  Leave your suitcase in the car.  After surgery it may be brought to your room. ? ?For patients admitted to the hospital, discharge time will be determined by your treatment team. ? ?Patients discharged the day of surgery will not be allowed to drive home and must have someone with them for 24 hours.  ? ? ?Special instructions:   DO NOT smoke tobacco or vape for 24 hours before your procedure. ? ?Please read over the following fact sheets that you were given. ?Anesthesia Post-op Instructions and Care and Recovery After Surgery ?  ? ? ? Colonoscopy, Adult, Care After ?The following information offers guidance on how to care for yourself after your procedure. Your health care provider may also give you more specific instructions. If you have problems or questions, contact your health care provider. ?What can I expect after the procedure? ?After the procedure, it is common to have: ?A small amount of blood in your stool for 24 hours after the procedure. ?Some gas. ?Mild cramping or bloating of your abdomen. ?Follow these instructions at home: ?Eating and drinking ? ?Drink enough fluid to keep your urine pale  yellow. ?Follow instructions from your health care provider about eating or drinking restrictions. ?Resume your normal diet as told by your health care provider. Avoid heavy or fried foods that are hard to digest. ?Activity ?Rest as told by your health care provider. ?Avoid sitting for a long time without moving. Get up to take short walks every 1-2 hours. This is important to improve blood flow and breathing. Ask for help if you feel weak or unsteady. ?Return to your normal activities as told by your health care provider. Ask your health care provider what activities are safe for you. ?Managing cramping and bloating ? ?Try walking around when you have cramps or feel bloated. ?If directed, apply heat to your abdomen as told by your health care provider. Use the heat source that your health care provider recommends, such as a moist heat pack or a heating pad. ?Place a towel between your skin and the heat source. ?Leave the heat on for 20-30 minutes. ?Remove the heat if your skin turns bright red. This is especially important if you are unable to feel pain, heat, or cold. You have a greater risk of getting burned. ?General instructions ?If you were given a sedative during the procedure, it can affect you for several hours. Do not drive or operate machinery until your health care provider says that it is safe. ?For the first  24 hours after the procedure: ?Do not sign important documents. ?Do not drink alcohol. ?Do your regular daily activities at a slower pace than normal. ?Eat soft foods that are easy to digest. ?Take over-the-counter and prescription medicines only as told by your health care provider. ?Keep all follow-up visits. This is important. ?Contact a health care provider if: ?You have blood in your stool 2-3 days after the procedure. ?Get help right away if: ?You have more than a small spotting of blood in your stool. ?You have large blood clots in your stool. ?You have swelling of your abdomen. ?You have  nausea or vomiting. ?You have a fever. ?You have increasing pain in your abdomen that is not relieved with medicine. ?These symptoms may be an emergency. Get help right away. Call 911. ?Do not wait to see if the symptoms will go away. ?Do not drive yourself to the hospital. ?Summary ?After the procedure, it is common to have a small amount of blood in your stool. You may also have mild cramping and bloating of your abdomen. ?If you were given a sedative during the procedure, it can affect you for several hours. Do not drive or operate machinery until your health care provider says that it is safe. ?Get help right away if you have a lot of blood in your stool, nausea or vomiting, a fever, or increased pain in your abdomen. ?This information is not intended to replace advice given to you by your health care provider. Make sure you discuss any questions you have with your health care provider. ?Document Revised: 01/03/2021 Document Reviewed: 01/03/2021 ?Elsevier Patient Education ? [place]. ?Monitored Anesthesia Care, Care After ?This sheet gives you information about how to care for yourself after your procedure. Your health care provider may also give you more specific instructions. If you have problems or questions, contact your health care provider. ?What can I expect after the procedure? ?After the procedure, it is common to have: ?Tiredness. ?Forgetfulness about what happened after the procedure. ?Impaired judgment for important decisions. ?Nausea or vomiting. ?Some difficulty with balance. ?Follow these instructions at home: ?For the time period you were told by your health care provider: ? ?  ? ?Rest as needed. ?Do not participate in activities where you could fall or become injured. ?Do not drive or use machinery. ?Do not drink alcohol. ?Do not take sleeping pills or medicines that cause drowsiness. ?Do not make important decisions or sign legal documents. ?Do not take care of children on your  own. ?Eating and drinking ?Follow the diet that is recommended by your health care provider. ?Drink enough fluid to keep your urine pale yellow. ?If you vomit: ?Drink water, juice, or soup when you can drink without vomiting. ?Make sure you have little or no nausea before eating solid foods. ?General instructions ?Have a responsible adult stay with you for the time you are told. It is important to have someone help care for you until you are awake and alert. ?Take over-the-counter and prescription medicines only as told by your health care provider. ?If you have sleep apnea, surgery and certain medicines can increase your risk for breathing problems. Follow instructions from your health care provider about wearing your sleep device: ?Anytime you are sleeping, including during daytime naps. ?While taking prescription pain medicines, sleeping medicines, or medicines that make you drowsy. ?Avoid smoking. ?Keep all follow-up visits as told by your health care provider. This is important. ?Contact a health care provider if: ?You keep feeling nauseous  or you keep vomiting. ?You feel light-headed. ?You are still sleepy or having trouble with balance after 24 hours. ?You develop a rash. ?You have a fever. ?You have redness or swelling around the IV site. ?Get help right away if: ?You have trouble breathing. ?You have new-onset confusion at home. ?Summary ?For several hours after your procedure, you may feel tired. You may also be forgetful and have poor judgment. ?Have a responsible adult stay with you for the time you are told. It is important to have someone help care for you until you are awake and alert. ?Rest as told. Do not drive or operate machinery. Do not drink alcohol or take sleeping pills. ?Get help right away if you have trouble breathing, or if you suddenly become confused. ?This information is not intended to replace advice given to you by your health care provider. Make sure you discuss any questions you  have with your health care provider. ?Document Revised: 04/17/2021 Document Reviewed: 04/15/2019 ?Elsevier Patient Education ? [place]. ? ?

## 2021-09-26 NOTE — Progress Notes (Signed)
? ?[facility] ?[address] ?[place], [place] [address] ? ? ?CLINIC:  ?Medical Oncology/Hematology ? ?CONSULT NOTE ? ?Patient Care Team: ?[place], [place] Dragon, FNP as PCP - General (Family Medicine) ?Eloise Harman, DO as Consulting Physician (Gastroenterology) ?Derek Jack, MD as Medical Oncologist (Medical Oncology) ?Brien Mates, RN as Oncology Nurse Navigator (Medical Oncology) ? ?CHIEF COMPLAINTS/PURPOSE OF CONSULTATION:  ?Evaluation of 13 mm right lung nodule and 4 cm splenic lesion ? ?HISTORY OF PRESENTING ILLNESS:  ?[name] 51 y.o. male is here because of evaluation of 13 mm right lung nodule and 4 cm splenic lesion, at the request of [facility]. ? ?Today he reports feeling fair. He reports severe pain in his left flank radiating from the left side of his abdomen to the left side of his back starting 8 months ago. At the time reports he was diagnosed with sciatica. He reports at the time of diagnosis this radiated down his left leg; the pain has resolved but he reports continued occasional numbness in his left thigh. He denies recent infections, fevers, night sweats, and weight loss. He denies history of CVA and MI. He reports his first colonoscopy is scheduled for 5/8.He denies history of kidney stones. He denies difficulty urinating.  ? ?He is currently unemployed due to his left flank pain, and he previously worked as a Games developer and at [employer]. His father had "throat cancer". He denies family history of lupus anticoagulant. He smokes 1/3 ppd of cigarettes, and he has smoked for 40 years. He also smokes marijuana. He denies family history of sarcoidosis.  ? ?MEDICAL HISTORY:  ?Past Medical History:  ?Diagnosis Date  ? Acid reflux   ? Hypertension   ? ? ?SURGICAL HISTORY: ?Past Surgical History:  ?Procedure Laterality Date  ? APPENDECTOMY    ? ? ?SOCIAL HISTORY: ?Social History  ? ?Socioeconomic History  ? Marital status: Legally Separated  ?   Spouse name: Not on file  ? Number of children: Not on file  ? Years of education: Not on file  ? Highest education level: Not on file  ?Occupational History  ? Not on file  ?Tobacco Use  ? Smoking status: Every Day  ?  Packs/day: 0.50  ?  Types: Cigarettes  ? Smokeless tobacco: Not on file  ?Substance and Sexual Activity  ? Alcohol use: Not Currently  ?  Comment: a beer every now and then  ? Drug use: Yes  ?  Types: Marijuana  ? Sexual activity: Not on file  ?Other Topics Concern  ? Not on file  ?Social History Narrative  ? Not on file  ? ?Social Determinants of Health  ? ?Financial Resource Strain: Not on file  ?Food Insecurity: Not on file  ?Transportation Needs: Not on file  ?Physical Activity: Not on file  ?Stress: Not on file  ?Social Connections: Not on file  ?Intimate Partner Violence: Not on file  ? ? ?FAMILY HISTORY: ?No family history on file. ? ?ALLERGIES:  ?has No Known Allergies. ? ?MEDICATIONS:  ?Current Outpatient Medications  ?Medication Sig Dispense Refill  ? amLODipine (NORVASC) 10 MG tablet Take 10 mg by mouth daily.    ? cyclobenzaprine (FLEXERIL) 10 MG tablet Take 10 mg by mouth 3 (three) times daily as needed for muscle spasms.    ? famotidine (PEPCID) 20 MG tablet Take 20 mg by mouth 2 (two) times daily.    ? hydrOXYzine (ATARAX) 25 MG tablet Take 25 mg by  mouth 3 (three) times daily as needed.    ? ibuprofen (ADVIL) 800 MG tablet Take 800 mg by mouth every 8 (eight) hours as needed.    ? losartan-hydrochlorothiazide (HYZAAR) 100-12.5 MG tablet Take 1 tablet by mouth daily.    ? omeprazole (PRILOSEC) 40 MG capsule Take 40 mg by mouth daily.    ? polyethylene glycol-electrolytes (TRILYTE) 420 g solution Take 4,000 mLs by mouth as directed. 4000 mL 0  ? traMADol (ULTRAM) 50 MG tablet Take by mouth every 6 (six) hours as needed.    ? ?No current facility-administered medications for this visit.  ? ? ?REVIEW OF SYSTEMS:   ?Review of Systems  ?Constitutional:  Positive for fatigue. Negative for  appetite change, fever and unexpected weight change.  ?Respiratory:  Positive for cough.   ?Cardiovascular:  Positive for chest pain.  ?Gastrointestinal:  Positive for abdominal pain (L).  ?Endocrine: Negative for hot flashes.  ?Genitourinary:  Negative for difficulty urinating.   ?Musculoskeletal:  Positive for back pain (7/10) and flank pain (L 7/10).  ?Neurological:  Positive for dizziness and numbness (L thigh).  ?Psychiatric/Behavioral:  Positive for depression and sleep disturbance. The patient is nervous/anxious.   ?All other systems reviewed and are negative. ? ? ?PHYSICAL EXAMINATION: ?ECOG PERFORMANCE STATUS: 0 - Asymptomatic ? ?Vitals:  ? 09/26/21 0824  ?BP: (!) 145/98  ?Pulse: 85  ?Resp: 18  ?Temp: 98 ?F (36.7 ?C)  ?SpO2: 98%  ? ?Filed Weights  ? 09/26/21 0824  ?Weight: 210 lb 4.8 oz (95.4 kg)  ? ?Physical Exam ?Vitals reviewed.  ?Constitutional:   ?   Appearance: Normal appearance.  ?Cardiovascular:  ?   Rate and Rhythm: Normal rate and regular rhythm.  ?   Pulses: Normal pulses.  ?   Heart sounds: Normal heart sounds.  ?Pulmonary:  ?   Effort: Pulmonary effort is normal.  ?   Breath sounds: Normal breath sounds.  ?Abdominal:  ?   Palpations: Abdomen is soft. There is no hepatomegaly, splenomegaly or mass.  ?   Tenderness: There is no abdominal tenderness.  ?Musculoskeletal:  ?   Lumbar back: Tenderness (L paraspinal) present.  ?   Right lower leg: No edema.  ?   Left lower leg: No edema.  ?   Comments: L lateral rib pain  ?Lymphadenopathy:  ?   Cervical: No cervical adenopathy.  ?   Right cervical: No superficial cervical adenopathy. ?   Left cervical: No superficial cervical adenopathy.  ?   Upper Body:  ?   Right upper body: No supraclavicular or axillary adenopathy.  ?   Left upper body: No supraclavicular or axillary adenopathy.  ?   Lower Body: No right inguinal adenopathy. No left inguinal adenopathy.  ?Neurological:  ?   General: No focal deficit present.  ?   Mental Status: He is alert and  oriented to person, place, and time.  ?Psychiatric:     ?   Mood and Affect: Mood normal.     ?   Behavior: Behavior normal.  ? ? ? ?LABORATORY DATA:  ?I have reviewed the data as listed ? ?  Latest Ref Rng & Units 09/26/2021  ?  9:03 AM 05/25/2019  ?  2:25 PM  ?CBC  ?WBC 4.0 - 10.5 K/uL 5.1   5.4    ?Hemoglobin 13.0 - 17.0 g/dL 14.7   13.8    ?Hematocrit 39.0 - 52.0 % 42.7   42.8    ?Platelets 150 - 400 K/uL 252  231    ? ? ?  Latest Ref Rng & Units 09/26/2021  ?  9:03 AM 05/25/2019  ?  2:25 PM  ?CMP  ?Glucose 70 - 99 mg/dL 108   91    ?BUN 6 - 20 mg/dL 16   17    ?Creatinine 0.61 - 1.24 mg/dL 0.96   0.87    ?Sodium 135 - 145 mmol/L 137   138    ?Potassium 3.5 - 5.1 mmol/L 3.1   4.0    ?Chloride 98 - 111 mmol/L 105   101    ?CO2 22 - 32 mmol/L 24   27    ?Calcium 8.9 - 10.3 mg/dL 9.1   9.1    ?Total Protein 6.5 - 8.1 g/dL 7.4     ?Total Bilirubin 0.3 - 1.2 mg/dL 0.7     ?Alkaline Phos 38 - 126 U/L 91     ?AST 15 - 41 U/L 15     ?ALT 0 - 44 U/L 15     ? ? ?RADIOGRAPHIC STUDIES: ?I have personally reviewed the radiological images as listed and agreed with the findings in the report. ?CT CHEST WO CONTRAST ? ?Result Date: 09/17/2021 ?CLINICAL DATA:  Follow-up pulmonary nodule seen on CT of the abdomen pelvis dated 07/31/2021. EXAM: CT CHEST WITHOUT CONTRAST TECHNIQUE: Multidetector CT imaging of the chest was performed following the standard protocol without IV contrast. RADIATION DOSE REDUCTION: This exam was performed according to the departmental dose-optimization program which includes automated exposure control, adjustment of the mA and/or kV according to patient size and/or use of iterative reconstruction technique. COMPARISON:  CT abdomen pelvis dated 07/31/2021 and chest CT dated 09/24/2020. FINDINGS: Evaluation of this exam is limited in the absence of intravenous contrast. Cardiovascular: There is no cardiomegaly or pericardial effusion. There is coronary vascular calcification involving the left circumflex  artery. The thoracic aorta and central pulmonary arteries are grossly unremarkable on this noncontrast CT. Mediastinum/Nodes: No hilar or mediastinal adenopathy. The esophagus and the thyroid gland are grossly unr

## 2021-09-26 NOTE — Patient Instructions (Addendum)
[place] [facility] ?Discharge Instructions ? ?You were seen and examined today by Dr. Delton Coombes. Dr. Delton Coombes is a medical oncologist, meaning that he specializes in the treatment of cancer diagnoses. Dr. Delton Coombes discussed your past medical history, family history of cancers, and the events that led to you being here today. ? ?You were referred to Dr. Delton Coombes due to an abnormal CT scan. On your recent CT scans, there is a lesion on your spleen as well as a nodule on your lung. The lung nodule is relatively unchanged in size and the spleen could simply be a cyst. Your pain is most likely not related to the CT scan findings. ? ?Dr. Delton Coombes has recommended a PET scan. A PET scan is a specialized CT scan that illuminates where there is cancer present in the body. This would indicate if either area noted on the CT scan is concerning for cancer. ? ?Follow-up as scheduled. ? ? ?Thank you for choosing [place] [facility] to provide your oncology and hematology care.  To afford each patient quality time with our provider, please arrive at least 15 minutes before your scheduled appointment time.  ? ?If you have a lab appointment with the [facility] please come in thru the Main Entrance and check in at the main information desk. ? ?You need to re-schedule your appointment should you arrive 10 or more minutes late.  We strive to give you quality time with our providers, and arriving late affects you and other patients whose appointments are after yours.  Also, if you no show three or more times for appointments you may be dismissed from the clinic at the providers discretion.     ?Again, thank you for choosing [facility].  Our hope is that these requests will decrease the amount of time that you wait before being seen by our physicians.       ?_____________________________________________________________ ? ?Should you have questions after  your visit to [facility], please contact our office at [phone] and follow the prompts.  Our office hours are 8:00 a.m. and 4:30 p.m. Monday - Friday.  Please note that voicemails left after 4:00 p.m. may not be returned until the following business day.  We are closed weekends and major holidays.  You do have access to a nurse 24-7, just call the main number to the clinic [phone] and do not press any options, hold on the line and a nurse will answer the phone.   ? ?For prescription refill requests, have your pharmacy contact our office and allow 72 hours.   ? ?Due to Covid, you will need to wear a mask upon entering the hospital. If you do not have a mask, a mask will be given to you at the Main Entrance upon arrival. For doctor visits, patients may have 1 support person age 77 or older with them. For treatment visits, patients can not have anyone with them due to social distancing guidelines and our immunocompromised population.  ? ? ? ?

## 2021-09-27 ENCOUNTER — Encounter (HOSPITAL_COMMUNITY): Payer: Self-pay

## 2021-09-27 ENCOUNTER — Encounter (HOSPITAL_COMMUNITY)
Admission: RE | Admit: 2021-09-27 | Discharge: 2021-09-27 | Disposition: A | Discharge status: Home / Self Care | Payer: 59 | Source: Ambulatory Visit | Attending: Internal Medicine | Admitting: Internal Medicine

## 2021-09-27 ENCOUNTER — Other Ambulatory Visit: Payer: Self-pay

## 2021-09-27 VITALS — BP 139/98 | HR 111 | Temp 98.0°F | Resp 18 | Ht 68.5 in | Wt 210.3 lb

## 2021-09-27 DIAGNOSIS — I10 Essential (primary) hypertension: Secondary | ICD-10-CM | POA: Insufficient documentation

## 2021-09-27 DIAGNOSIS — Z01810 Encounter for preprocedural cardiovascular examination: Secondary | ICD-10-CM | POA: Diagnosis not present

## 2021-09-27 HISTORY — DX: Family history of other specified conditions: Z84.89

## 2021-10-01 ENCOUNTER — Ambulatory Visit (HOSPITAL_COMMUNITY): Payer: 59 | Admitting: Anesthesiology

## 2021-10-01 ENCOUNTER — Ambulatory Visit (HOSPITAL_COMMUNITY)
Admission: RE | Admit: 2021-10-01 | Discharge: 2021-10-01 | Disposition: A | Discharge status: Home / Self Care | Payer: 59 | Attending: Internal Medicine | Admitting: Internal Medicine

## 2021-10-01 ENCOUNTER — Ambulatory Visit (HOSPITAL_BASED_OUTPATIENT_CLINIC_OR_DEPARTMENT_OTHER): Payer: 59 | Admitting: Anesthesiology

## 2021-10-01 ENCOUNTER — Encounter (HOSPITAL_COMMUNITY): Payer: Self-pay

## 2021-10-01 ENCOUNTER — Encounter (HOSPITAL_COMMUNITY)
Admission: RE | Disposition: A | Discharge status: Home / Self Care | Payer: Self-pay | Source: Home / Self Care | Attending: Internal Medicine

## 2021-10-01 DIAGNOSIS — R1012 Left upper quadrant pain: Secondary | ICD-10-CM | POA: Insufficient documentation

## 2021-10-01 DIAGNOSIS — F17210 Nicotine dependence, cigarettes, uncomplicated: Secondary | ICD-10-CM | POA: Diagnosis not present

## 2021-10-01 DIAGNOSIS — Z1211 Encounter for screening for malignant neoplasm of colon: Secondary | ICD-10-CM | POA: Insufficient documentation

## 2021-10-01 DIAGNOSIS — K648 Other hemorrhoids: Secondary | ICD-10-CM | POA: Insufficient documentation

## 2021-10-01 DIAGNOSIS — Z79899 Other long term (current) drug therapy: Secondary | ICD-10-CM | POA: Insufficient documentation

## 2021-10-01 DIAGNOSIS — K635 Polyp of colon: Secondary | ICD-10-CM

## 2021-10-01 DIAGNOSIS — D734 Cyst of spleen: Secondary | ICD-10-CM | POA: Diagnosis not present

## 2021-10-01 DIAGNOSIS — R69 Illness, unspecified: Secondary | ICD-10-CM | POA: Diagnosis not present

## 2021-10-01 DIAGNOSIS — K5909 Other constipation: Secondary | ICD-10-CM | POA: Diagnosis not present

## 2021-10-01 DIAGNOSIS — I10 Essential (primary) hypertension: Secondary | ICD-10-CM | POA: Diagnosis not present

## 2021-10-01 DIAGNOSIS — D122 Benign neoplasm of ascending colon: Secondary | ICD-10-CM | POA: Insufficient documentation

## 2021-10-01 DIAGNOSIS — K219 Gastro-esophageal reflux disease without esophagitis: Secondary | ICD-10-CM | POA: Insufficient documentation

## 2021-10-01 DIAGNOSIS — D3A026 Benign carcinoid tumor of the rectum: Secondary | ICD-10-CM | POA: Diagnosis not present

## 2021-10-01 HISTORY — PX: COLONOSCOPY WITH PROPOFOL: SHX5780

## 2021-10-01 HISTORY — PX: POLYPECTOMY: SHX5525

## 2021-10-01 SURGERY — COLONOSCOPY WITH PROPOFOL
Anesthesia: General

## 2021-10-01 MED ORDER — LACTATED RINGERS IV SOLN: INTRAVENOUS | Status: DC

## 2021-10-01 MED ORDER — PROPOFOL 10 MG/ML IV BOLUS
INTRAVENOUS | Status: DC | PRN
  Administered 2021-10-01 (×2): 50 mg via INTRAVENOUS
  Administered 2021-10-01: 80 mg via INTRAVENOUS

## 2021-10-01 MED ORDER — PROPOFOL 500 MG/50ML IV EMUL
INTRAVENOUS | Status: DC | PRN
  Administered 2021-10-01: 150 ug/kg/min via INTRAVENOUS

## 2021-10-01 NOTE — Transfer of Care (Signed)
Immediate Anesthesia Transfer of Care Note ? ?Patient: Kyle Reed ? ?Procedure(s) Performed: COLONOSCOPY WITH PROPOFOL ?POLYPECTOMY ? ?Patient Location: Short Stay ? ?Anesthesia Type:General ? ?Level of Consciousness: awake ? ?Airway & Oxygen Therapy: Patient Spontanous Breathing ? ?Post-op Assessment: Report given to RN ? ?Post vital signs: Reviewed and stable ? ?Last Vitals:  ?Vitals Value Taken Time  ?BP 104/72 10/01/21 1530  ?Temp 36.9 ?C 10/01/21 1530  ?Pulse 97 10/01/21 1530  ?Resp 25 10/01/21 1530  ?SpO2 99 % 10/01/21 1530  ? ? ?Last Pain:  ?Vitals:  ? 10/01/21 1530  ?TempSrc: Oral  ?PainSc: 0-No pain  ?   ? ?  ? ?Complications: No notable events documented. ?

## 2021-10-01 NOTE — Op Note (Signed)
[facility] ?Patient Name: Kyle Reed ?Procedure Date: 10/01/2021 3:04 PM ?MRN: [id number] ?Date of Birth: [birth year] ?Attending MD: Elon Alas. Abbey Chatters , DO ?CSN: [id number] ?Age: 51 ?Admit Type: Outpatient ?Procedure:                Colonoscopy ?Indications:              Screening for colorectal malignant neoplasm ?Providers:                Elon Alas. Abbey Chatters, DO, Caprice Kluver, Gwynneth Albright RN, RN, Lambert Mody, Randa Spike,  ?                          Technician ?Referring MD:              ?Medicines:                See the Anesthesia note for documentation of the  ?                          administered medications ?Complications:            No immediate complications. ?Estimated Blood Loss:     Estimated blood loss was minimal. ?Procedure:                Pre-Anesthesia Assessment: ?                          - The anesthesia plan was to use monitored  ?                          anesthesia care (MAC). ?                          After obtaining informed consent, the colonoscope  ?                          was passed under direct vision. Throughout the  ?                          procedure, the patient's blood pressure, pulse, and  ?                          oxygen saturations were monitored continuously. The  ?                          [id number]-HQ190L ([id number]) scope was introduced through  ?                          the anus and advanced to the the cecum, identified  ?                          by appendiceal orifice and ileocecal valve. The  ?                          colonoscopy was performed without difficulty. The  ?  patient tolerated the procedure well. The quality  ?                          of the bowel preparation was evaluated using the  ?                          BBPS [facility] Bowel Preparation Scale) with scores  ?                          of: Right Colon = 3, Transverse Colon = 3 and Left  ?                          Colon = 3 (entire  mucosa seen well with no residual  ?                          staining, small fragments of stool or opaque  ?                          liquid). The total BBPS score equals 9. ?Scope In: 3:13:59 PM ?Scope Out: 3:27:10 PM ?Scope Withdrawal Time: 0 hours 11 minutes 13 seconds  ?Total Procedure Duration: 0 hours 13 minutes 11 seconds  ?Findings: ?     The perianal and digital rectal examinations were normal. ?     Non-bleeding internal hemorrhoids were found during endoscopy. ?     A 5 mm polyp was found in the ascending colon. The polyp was sessile.  ?     The polyp was removed with a cold snare. Resection and retrieval were  ?     complete. ?     Two sessile polyps were found in the rectum. The polyps were 4 to 6 mm  ?     in size. These polyps were removed with a cold snare. Resection and  ?     retrieval were complete. ?     The exam was otherwise without abnormality. ?Impression:               - Non-bleeding internal hemorrhoids. ?                          - One 5 mm polyp in the ascending colon, removed  ?                          with a cold snare. Resected and retrieved. ?                          - Two 4 to 6 mm polyps in the rectum, removed with  ?                          a cold snare. Resected and retrieved. ?                          - The examination was otherwise normal. ?Moderate Sedation: ?     Per Anesthesia Care ?Recommendation:           - Patient has a contact number available for  ?  emergencies. The signs and symptoms of potential  ?                          delayed complications were discussed with the  ?                          patient. Return to normal activities tomorrow.  ?                          Written discharge instructions were provided to the  ?                          patient. ?                          - Resume previous diet. ?                          - Continue present medications. ?                          - Await pathology results. ?                           - Repeat colonoscopy in 5 years for surveillance. ?                          - Return to GI clinic in 4 months. ?Procedure Code(s):        --- Professional --- ?                          [phone], Colonoscopy, flexible; with removal of  ?                          tumor(s), polyp(s), or other lesion(s) by snare  ?                          technique ?Diagnosis Code(s):        --- Professional --- ?                          Z12.11, Encounter for screening for malignant  ?                          neoplasm of colon ?                          K63.5, Polyp of colon ?                          K62.1, Rectal polyp ?                          K64.8, Other hemorrhoids ?CPT copyright 2019 American Medical Association. All rights reserved. ?The codes documented in this report are preliminary and upon coder review may  ?be revised to meet current compliance requirements. ?Elon Alas. Abbey Chatters, DO ?Elon Alas. Abbey Chatters, DO ?  10/01/2021 3:29:58 PM ?This report has been signed electronically. ?Number of Addenda: 0 ?

## 2021-10-01 NOTE — Discharge Instructions (Addendum)
?  Colonoscopy ?Discharge Instructions ? ?Read the instructions outlined below and refer to this sheet in the next few weeks. These discharge instructions provide you with general information on caring for yourself after you leave the hospital. Your doctor may also give you specific instructions. While your treatment has been planned according to the most current medical practices available, unavoidable complications occasionally occur.  ? ?ACTIVITY ?You may resume your regular activity, but move at a slower pace for the next 24 hours.  ?Take frequent rest periods for the next 24 hours.  ?Walking will help get rid of the air and reduce the bloated feeling in your belly (abdomen).  ?No driving for 24 hours (because of the medicine (anesthesia) used during the test).   ?Do not sign any important legal documents or operate any machinery for 24 hours (because of the anesthesia used during the test).  ?NUTRITION ?Drink plenty of fluids.  ?You may resume your normal diet as instructed by your doctor.  ?Begin with a light meal and progress to your normal diet. Heavy or fried foods are harder to digest and may make you feel sick to your stomach (nauseated).  ?Avoid alcoholic beverages for 24 hours or as instructed.  ?MEDICATIONS ?You may resume your normal medications unless your doctor tells you otherwise.  ?WHAT YOU CAN EXPECT TODAY ?Some feelings of bloating in the abdomen.  ?Passage of more gas than usual.  ?Spotting of blood in your stool or on the toilet paper.  ?IF YOU HAD POLYPS REMOVED DURING THE COLONOSCOPY: ?No aspirin products for 7 days or as instructed.  ?No alcohol for 7 days or as instructed.  ?Eat a soft diet for the next 24 hours.  ?FINDING OUT THE RESULTS OF YOUR TEST ?Not all test results are available during your visit. If your test results are not back during the visit, make an appointment with your caregiver to find out the results. Do not assume everything is normal if you have not heard from your  caregiver or the medical facility. It is important for you to follow up on all of your test results.  ?SEEK IMMEDIATE MEDICAL ATTENTION IF: ?You have more than a spotting of blood in your stool.  ?Your belly is swollen (abdominal distention).  ?You are nauseated or vomiting.  ?You have a temperature over 101.  ?You have abdominal pain or discomfort that is severe or gets worse throughout the day.  ? ?Your colonoscopy revealed 3 polyp(s) which I removed successfully. Await pathology results, my office will contact you. I recommend repeating colonoscopy in 5 years for surveillance purposes. Follow up with GI in 4-6 months.  ? ? ?I hope you have a great rest of your week! ? ?Elon Alas. Abbey Chatters, D.O. ?Gastroenterology and Hepatology ?[facility] ? ?

## 2021-10-01 NOTE — Anesthesia Postprocedure Evaluation (Signed)
Anesthesia Post Note ? ?Patient: Kyle Reed ? ?Procedure(s) Performed: COLONOSCOPY WITH PROPOFOL ?POLYPECTOMY ? ?Patient location during evaluation: Short Stay ?Anesthesia Type: General ?Level of consciousness: awake and alert ?Pain management: pain level controlled ?Vital Signs Assessment: post-procedure vital signs reviewed and stable ?Respiratory status: nonlabored ventilation ?Cardiovascular status: blood pressure returned to baseline and stable ?Postop Assessment: no apparent nausea or vomiting ?Anesthetic complications: no ? ? ?No notable events documented. ? ? ?Last Vitals:  ?Vitals:  ? 10/01/21 1503 10/01/21 1530  ?BP: (!) 145/97 104/72  ?Pulse:  97  ?Resp:  (!) 25  ?Temp:  36.9 ?C  ?SpO2:  99%  ?  ?Last Pain:  ?Vitals:  ? 10/01/21 1530  ?TempSrc: Oral  ?PainSc: 0-No pain  ? ? ?  ?  ?  ?  ?  ?  ? ?Cherylee Rawlinson ? ? ? ? ?

## 2021-10-01 NOTE — Interval H&P Note (Signed)
History and Physical Interval Note: ? ?10/01/2021 ?3:03 PM ? ?Kyle Reed  has presented today for surgery, with the diagnosis of screening colonoscopy.  The various methods of treatment have been discussed with the patient and family. After consideration of risks, benefits and other options for treatment, the patient has consented to  Procedure(s) with comments: ?COLONOSCOPY WITH PROPOFOL (N/A) - 3:00pm as a surgical intervention.  The patient's history has been reviewed, patient examined, no change in status, stable for surgery.  I have reviewed the patient's chart and labs.  Questions were answered to the patient's satisfaction.   ? ? ?Eloise Harman ? ? ?

## 2021-10-01 NOTE — Anesthesia Preprocedure Evaluation (Signed)
Anesthesia Evaluation  Patient identified by MRN, date of birth, ID band Patient awake    Reviewed: Allergy & Precautions, H&P , NPO status , Patient's Chart, lab work & pertinent test results, reviewed documented beta blocker date and time   Airway Mallampati: II  TM Distance: >3 FB Neck ROM: full    Dental no notable dental hx.    Pulmonary neg pulmonary ROS, Current Smoker,    Pulmonary exam normal breath sounds clear to auscultation       Cardiovascular Exercise Tolerance: Good hypertension, negative cardio ROS   Rhythm:regular Rate:Normal     Neuro/Psych negative neurological ROS  negative psych ROS   GI/Hepatic Neg liver ROS, GERD  Medicated,  Endo/Other  negative endocrine ROS  Renal/GU negative Renal ROS  negative genitourinary   Musculoskeletal   Abdominal   Peds  Hematology negative hematology ROS (+)   Anesthesia Other Findings   Reproductive/Obstetrics negative OB ROS                             Anesthesia Physical Anesthesia Plan  ASA: 2  Anesthesia Plan: General   Post-op Pain Management:    Induction:   PONV Risk Score and Plan: Propofol infusion  Airway Management Planned:   Additional Equipment:   Intra-op Plan:   Post-operative Plan:   Informed Consent: I have reviewed the patients History and Physical, chart, labs and discussed the procedure including the risks, benefits and alternatives for the proposed anesthesia with the patient or authorized representative who has indicated his/her understanding and acceptance.     Dental Advisory Given  Plan Discussed with: CRNA  Anesthesia Plan Comments:         Anesthesia Quick Evaluation  

## 2021-10-04 ENCOUNTER — Other Ambulatory Visit: Payer: Self-pay | Admitting: Internal Medicine

## 2021-10-04 ENCOUNTER — Telehealth: Payer: Self-pay

## 2021-10-04 DIAGNOSIS — D3A026 Benign carcinoid tumor of the rectum: Secondary | ICD-10-CM

## 2021-10-04 LAB — SURGICAL PATHOLOGY

## 2021-10-04 NOTE — Telephone Encounter (Signed)
-----   Message from Irving Copas., MD sent at 10/04/2021  2:45 PM EDT ----- ?Lupita Leash, ?Thanks for update. ?Jonavan Vanhorn, please work on scheduling this patient with me for [facility] EUS with EMR next available with me.  DJ, if you are interested in this procedure, let Norvil Martensen know and she can schedule with you. ?Thanks to all. ?GM ?----- Message ----- ?From: Eloise Harman, DO ?Sent: 10/04/2021   2:42 PM EDT ?To: Milus Banister, MD, Derek Jack, MD, # ? ?Jannifer Rodney, ? ?Thanks for getting back to me so quickly.  I discussed with patient and he is agreeable to EUS with possible EMR and is expecting Falen Lehrmann's call.  I have ordered a baseline chromogranin A level.  Patient states he will go have this drawn when he can. ? ?On a side note he does have a 1.5 cm lung nodule as well as splenic lesion.  Currently following with Dr. Delton Coombes of oncology with plans for PET scan in the future.  I will add him to this thread so he is aware of what is going on. ? ?Dr. Raliegh Ip, patient's recent colonoscopy showed 2 small rectal polyps, both carcinoid with positive margins. ? ?Thank you guys, ?[place] ? ?----- Message ----- ?From: Mansouraty, Telford Nab., MD ?Sent: 10/04/2021   2:30 PM EDT ?To: Milus Banister, MD, Eloise Harman, DO ? ?[facility], ?No worries, sounds like we need to do an EUS with possible EMR if there is anything that is residual or just go ahead and resect the scar sites that are noted.  It is going to be July or August for most of our procedures but that should be fine.  Would obtain a chromogranin A level as a baseline and if elevated, obtain a CT abdomen/pelvis with IV contrast.  If the patient is willing please reply back and we can forward this to [place] and she can work on scheduling the EUS. ?Thanks. ?GM ?----- Message ----- ?From: Eloise Harman, DO ?Sent: 10/04/2021   1:11 PM EDT ?To: Milus Banister, MD, # ? ?DJ and GM ? ?Wanted to run this patient by you.  Underwent screening colonoscopy 10/01/2021.  2 small polyps found in the  rectum which were removed with cold snare.  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  Wanted to get your thoughts on possible EUS with EMR? ? ?As always thank you guys ? ?[place] ? ? ? ? ?

## 2021-10-04 NOTE — Telephone Encounter (Signed)
Milus Banister, MD  Mansouraty, Telford Nab., MD; Eloise Harman, DO; Timothy Lasso, RN ?Cc: Derek Jack, MD ?I'm happy to help with it.  Agree with your plan  ? ?Thanks  ?

## 2021-10-05 ENCOUNTER — Other Ambulatory Visit: Payer: Self-pay

## 2021-10-05 DIAGNOSIS — D3A026 Benign carcinoid tumor of the rectum: Secondary | ICD-10-CM

## 2021-10-05 NOTE — Telephone Encounter (Signed)
[facility] EUS has been scheduled for 12/20/21 at [facility] with DJ at 830 am ? ?EUS scheduled, pt instructed and medications reviewed.  Patient instructions mailed to home.  Patient to call with any questions or concerns.  ? ?

## 2021-10-08 ENCOUNTER — Encounter (HOSPITAL_COMMUNITY): Payer: Self-pay | Admitting: Internal Medicine

## 2021-10-11 ENCOUNTER — Ambulatory Visit (HOSPITAL_COMMUNITY): Admission: RE | Admit: 2021-10-11 | Payer: 59 | Source: Ambulatory Visit

## 2021-10-13 DIAGNOSIS — N3289 Other specified disorders of bladder: Secondary | ICD-10-CM | POA: Diagnosis not present

## 2021-10-13 DIAGNOSIS — R69 Illness, unspecified: Secondary | ICD-10-CM | POA: Diagnosis not present

## 2021-10-13 DIAGNOSIS — D734 Cyst of spleen: Secondary | ICD-10-CM | POA: Diagnosis not present

## 2021-10-13 DIAGNOSIS — R109 Unspecified abdominal pain: Secondary | ICD-10-CM | POA: Diagnosis not present

## 2021-10-13 DIAGNOSIS — I700 Atherosclerosis of aorta: Secondary | ICD-10-CM | POA: Diagnosis not present

## 2021-10-13 DIAGNOSIS — R1012 Left upper quadrant pain: Secondary | ICD-10-CM | POA: Diagnosis not present

## 2021-10-17 ENCOUNTER — Inpatient Hospital Stay (HOSPITAL_COMMUNITY): Payer: 59 | Admitting: Hematology

## 2021-10-18 ENCOUNTER — Encounter (HOSPITAL_COMMUNITY)
Admission: RE | Admit: 2021-10-18 | Discharge: 2021-10-18 | Disposition: A | Discharge status: Home / Self Care | Payer: 59 | Source: Ambulatory Visit | Attending: Hematology | Admitting: Hematology

## 2021-10-18 DIAGNOSIS — D7389 Other diseases of spleen: Secondary | ICD-10-CM | POA: Insufficient documentation

## 2021-10-18 DIAGNOSIS — D734 Cyst of spleen: Secondary | ICD-10-CM | POA: Diagnosis not present

## 2021-10-18 DIAGNOSIS — I700 Atherosclerosis of aorta: Secondary | ICD-10-CM | POA: Diagnosis not present

## 2021-10-18 DIAGNOSIS — R911 Solitary pulmonary nodule: Secondary | ICD-10-CM | POA: Diagnosis not present

## 2021-10-18 MED ORDER — FLUDEOXYGLUCOSE F - 18 (FDG) INJECTION
10.5100 | Freq: Once | INTRAVENOUS | Status: AC | PRN
  Administered 2021-10-18: 10.51 via INTRAVENOUS

## 2021-10-24 ENCOUNTER — Inpatient Hospital Stay (HOSPITAL_BASED_OUTPATIENT_CLINIC_OR_DEPARTMENT_OTHER): Payer: 59 | Admitting: Hematology

## 2021-10-24 ENCOUNTER — Other Ambulatory Visit (HOSPITAL_COMMUNITY): Payer: Self-pay | Admitting: Hematology

## 2021-10-24 VITALS — BP 129/89 | HR 114 | Temp 96.9°F | Resp 20 | Ht 68.5 in | Wt 201.2 lb

## 2021-10-24 DIAGNOSIS — R911 Solitary pulmonary nodule: Secondary | ICD-10-CM

## 2021-10-24 DIAGNOSIS — D7389 Other diseases of spleen: Secondary | ICD-10-CM | POA: Diagnosis not present

## 2021-10-24 DIAGNOSIS — M546 Pain in thoracic spine: Secondary | ICD-10-CM | POA: Diagnosis not present

## 2021-10-24 DIAGNOSIS — C7A8 Other malignant neuroendocrine tumors: Secondary | ICD-10-CM | POA: Diagnosis not present

## 2021-10-24 NOTE — Progress Notes (Signed)
[address], [place] [id number]   CLINIC:  Medical Oncology/Hematology  PCP:  Olga Coaster, FNP [address] [place] [address] [phone]   REASON FOR VISIT:  Follow-up for right lung nodule and splenic lesion  PRIOR THERAPY: none  NGS Results: not done  CURRENT THERAPY: under work-up  BRIEF ONCOLOGIC HISTORY:  Oncology History   No history exists.    CANCER STAGING: Cancer Staging  No matching staging information was found for the patient.  INTERVAL HISTORY:  [name], a 51 y.o. male, returns for routine follow-up of his right lung nodule and splenic lesion. Vir was last seen on 09/26/2021.   Today he reports feeling good. He underwent a colonoscopy on 10/01/2021. He reports left flank and back pain starting 6-8 months ago; he reports this pain does not radiate. He reports numbness in his left leg due to a history of sciatica.   REVIEW OF SYSTEMS:  Review of Systems  Constitutional:  Negative for appetite change and fatigue.  Respiratory:  Positive for shortness of breath.   Cardiovascular:  Positive for chest pain.  Gastrointestinal:  Positive for diarrhea.  Musculoskeletal:  Positive for back pain (6/10) and flank pain (6/10 L side).  Neurological:  Positive for headaches and numbness (L leg).  Psychiatric/Behavioral:  Positive for depression and sleep disturbance. The patient is nervous/anxious.   All other systems reviewed and are negative.  PAST MEDICAL/SURGICAL HISTORY:  Past Medical History:  Diagnosis Date   Acid reflux    Family history of adverse reaction to anesthesia    Hypertension    Past Surgical History:  Procedure Laterality Date   APPENDECTOMY     COLONOSCOPY WITH PROPOFOL N/A 10/01/2021   Procedure: COLONOSCOPY WITH PROPOFOL;  Surgeon: Eloise Harman, DO;  [facility]: [facility];  Service: Endoscopy;  Laterality: N/A;  3:00pm   POLYPECTOMY  10/01/2021   Procedure: POLYPECTOMY;   Surgeon: Eloise Harman, DO;  Location: [facility];  Service: Endoscopy;;    SOCIAL HISTORY:  Social History   Socioeconomic History   Marital status: Legally Separated    Spouse name: Not on file   Number of children: Not on file   Years of education: Not on file   Highest education level: Not on file  Occupational History   Not on file  Tobacco Use   Smoking status: Every Day    Packs/day: 0.50    Types: Cigarettes   Smokeless tobacco: Not on file  Substance and Sexual Activity   Alcohol use: Not Currently    Comment: a beer every now and then   Drug use: Yes    Types: Marijuana   Sexual activity: Not on file  Other Topics Concern   Not on file  Social History Narrative   Not on file   Social Determinants of Health   Financial Resource Strain: Not on file  Food Insecurity: Not on file  Transportation Needs: Not on file  Physical Activity: Not on file  Stress: Not on file  Social Connections: Not on file  Intimate Partner Violence: Not on file    FAMILY HISTORY:  No family history on file.  CURRENT MEDICATIONS:  Current Outpatient Medications  Medication Sig Dispense Refill   [START ON 10/30/2021] amLODipine (NORVASC) 10 MG tablet Take 10 mg by mouth in the morning.     Cholecalciferol (VITAMIN D3) 1.25 MG (50000 UT) CAPS Take 50,000 Units by mouth every  Sunday.     cyclobenzaprine (FLEXERIL) 10 MG tablet Take 10 mg by mouth at bedtime.     famotidine (PEPCID) 20 MG tablet Take 20 mg by mouth at bedtime.     hydrOXYzine (VISTARIL) 25 MG capsule Take 25 mg by mouth at bedtime as needed (sleep).     ibuprofen (ADVIL) 800 MG tablet Take 800 mg by mouth every 8 (eight) hours as needed (for pain.).     losartan-hydrochlorothiazide (HYZAAR) 100-12.5 MG tablet Take 1 tablet by mouth in the morning.     omeprazole (PRILOSEC) 40 MG capsule Take 40 mg by mouth at bedtime.     traMADol (ULTRAM) 50 MG tablet Take 50 mg by mouth in the morning.     No current  facility-administered medications for this visit.    ALLERGIES:  No Known Allergies  PHYSICAL EXAM:  Performance status (ECOG): 0 - Asymptomatic  There were no vitals filed for this visit. Wt Readings from Last 3 Encounters:  09/27/21 210 lb 4.8 oz (95.4 kg)  09/26/21 210 lb 4.8 oz (95.4 kg)  09/20/21 207 lb 3.2 oz (94 kg)   Physical Exam Vitals reviewed.  Constitutional:      Appearance: Normal appearance.  Cardiovascular:     Rate and Rhythm: Normal rate and regular rhythm.     Pulses: Normal pulses.     Heart sounds: Normal heart sounds.  Pulmonary:     Effort: Pulmonary effort is normal.     Breath sounds: Normal breath sounds.  Neurological:     General: No focal deficit present.     Mental Status: He is alert and oriented to person, place, and time.  Psychiatric:        Mood and Affect: Mood normal.        Behavior: Behavior normal.     LABORATORY DATA:  I have reviewed the labs as listed.     Latest Ref Rng & Units 09/26/2021    9:03 AM 05/25/2019    2:25 PM  CBC  WBC 4.0 - 10.5 K/uL 5.1   5.4    Hemoglobin 13.0 - 17.0 g/dL 14.7   13.8    Hematocrit 39.0 - 52.0 % 42.7   42.8    Platelets 150 - 400 K/uL 252   231        Latest Ref Rng & Units 09/26/2021    9:03 AM 05/25/2019    2:25 PM  CMP  Glucose 70 - 99 mg/dL 108   91    BUN 6 - 20 mg/dL 16   17    Creatinine 0.61 - 1.24 mg/dL 0.96   0.87    Sodium 135 - 145 mmol/L 137   138    Potassium 3.5 - 5.1 mmol/L 3.1   4.0    Chloride 98 - 111 mmol/L 105   101    CO2 22 - 32 mmol/L 24   27    Calcium 8.9 - 10.3 mg/dL 9.1   9.1    Total Protein 6.5 - 8.1 g/dL 7.4     Total Bilirubin 0.3 - 1.2 mg/dL 0.7     Alkaline Phos 38 - 126 U/L 91     AST 15 - 41 U/L 15     ALT 0 - 44 U/L 15       DIAGNOSTIC IMAGING:  I have independently reviewed the scans and discussed with the patient. NM PET Image Initial (PI) Skull Base To Thigh (F-18 FDG)  Result Date: 10/21/2021  CLINICAL DATA:  Initial treatment strategy  for lung nodule. EXAM: NUCLEAR MEDICINE PET SKULL BASE TO THIGH TECHNIQUE: 10.5 mCi F-18 FDG was injected intravenously. Full-ring PET imaging was performed from the skull base to thigh after the radiotracer. CT data was obtained and used for attenuation correction and anatomic localization. Fasting blood glucose: 89 mg/dl COMPARISON:  CT chest 09/14/2021, 09/24/2020 and CT abdomen pelvis 10/13/2021. FINDINGS: Mediastinal blood pool activity: SUV max 2.3 Liver activity: SUV max NA NECK: No abnormal hypermetabolism. Incidental CT findings: None. CHEST: No hypermetabolic mediastinal, hilar or axillary lymph nodes. No abnormal hypermetabolism associated with a smoothly marginated 1.2 x 1.4 cm nodule along the minor fissure (7/65). Incidental CT findings: Heart is at the upper limits of normal in size to mildly enlarged. No pericardial or pleural effusion. ABDOMEN/PELVIS: No abnormal hypermetabolism in the liver, adrenal glands, spleen or pancreas. No hypermetabolic lymph nodes. Incidental CT findings: Liver, gallbladder and right adrenal gland are unremarkable. Left adrenal thickening. No specific follow-up necessary. Kidneys are unremarkable. 3.6 cm fluid density cyst in the medial spleen, as before. Patient reportedly status post splenic nodule removal on 10/01/2021. Spleen, pancreas, stomach and bowel are otherwise unremarkable. Atherosclerotic calcification of the aorta. SKELETON: No abnormal hypermetabolism. Incidental CT findings: Mild degenerative changes in the spine. IMPRESSION: 1. Smoothly marginated 13 mm nodule along the minor fissure without abnormal hypermetabolism. Per report, size is stable from 09/24/2020. Findings strongly favor a benign lesion. Additional follow-up CT chest without contrast could be performed in 1 year for 2 years of documented stability. 2.  Aortic atherosclerosis (ICD10-I70.0). Electronically Signed   By: Lorin Picket M.D.   On: 10/21/2021 08:38     ASSESSMENT:  Right lung  nodule and splenic lesion: - CT abdomen (07/31/2021): Rounded hypodense lesion in the spleen measuring 2.7 cm, may represent cyst or hemangioma.  Spleen is not enlarged.  No focal liver lesion. - CT chest without contrast (09/14/2021): 1.5 cm subpleural nodule in the right middle lobe dating back to 09/24/2020. - Patient current active smoker. - Reported 6 to 59-monthhistory of left loin pain, thought to be sciatica. - No B symptoms.    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 - He smokes cigarettes, 1 pack every 3 days for 40 years.  He also smokes marijuana. - Father had throat cancer.   PLAN:  Right lung nodule and splenic lesion: - Labs indicate LDH was normal. - He does not have any B symptoms. - Reviewed PET scan from 10/21/2021.  Smoothly marginated 13 mm nodule in the right lung is not hypermetabolic and favors a benign lesion.  Additional follow-up CT chest without contrast in 1 year was recommended. - There is hypodense lesion in the spleen without uptake consistent with cyst. - He complains of pain in the left loin region at the area of spleen in the back.  I do not know the etiology of the pain.  I do not know if the splenic lesion is contributing to the pain.  I have recommended MRI of the thoracic spine to evaluate for any nerve related pain.  2.  Carcinoid of the rectal polyp: - Had colonoscopy on 10/01/2021. - Pathology showed tubular adenoma in the ascending colon polyp.  Rectal polyp showed well-differentiated neuroendocrine tumor, grade 1, positive margins, tumor involves deep margins.  IHC positive for CD56, synaptophysin and negative for chromogranin.  Ki-67 less than 2%. - He  has follow-up to see Dr. Eugenio Hoes for EUS in July. - Recommend 24-hour urine for 5-hydroxyindoleacetic acid.  Also recommend chromogranin.   Orders placed this encounter:  No orders of the defined types were placed  in this encounter.    Derek Jack, MD [place] [phone]   I, Thana Ates, am acting as a scribe for Dr. Derek Jack.  I, Derek Jack MD, have reviewed the above documentation for accuracy and completeness, and I agree with the above.

## 2021-11-02 DIAGNOSIS — D734 Cyst of spleen: Secondary | ICD-10-CM | POA: Diagnosis not present

## 2021-11-02 DIAGNOSIS — R109 Unspecified abdominal pain: Secondary | ICD-10-CM | POA: Diagnosis not present

## 2021-11-02 DIAGNOSIS — R911 Solitary pulmonary nodule: Secondary | ICD-10-CM | POA: Diagnosis not present

## 2021-11-02 DIAGNOSIS — R69 Illness, unspecified: Secondary | ICD-10-CM | POA: Diagnosis not present

## 2021-11-02 DIAGNOSIS — D126 Benign neoplasm of colon, unspecified: Secondary | ICD-10-CM | POA: Diagnosis not present

## 2021-11-12 ENCOUNTER — Inpatient Hospital Stay (HOSPITAL_COMMUNITY): Payer: 59 | Attending: Hematology

## 2021-11-12 ENCOUNTER — Ambulatory Visit (HOSPITAL_COMMUNITY)
Admission: RE | Admit: 2021-11-12 | Discharge: 2021-11-12 | Disposition: A | Discharge status: Home / Self Care | Payer: 59 | Source: Ambulatory Visit | Attending: Hematology | Admitting: Hematology

## 2021-11-12 DIAGNOSIS — M546 Pain in thoracic spine: Secondary | ICD-10-CM | POA: Insufficient documentation

## 2021-11-12 DIAGNOSIS — C7A8 Other malignant neuroendocrine tumors: Secondary | ICD-10-CM

## 2021-11-14 ENCOUNTER — Other Ambulatory Visit: Payer: Self-pay | Admitting: *Deleted

## 2021-11-14 DIAGNOSIS — M545 Low back pain, unspecified: Secondary | ICD-10-CM | POA: Diagnosis not present

## 2021-11-14 DIAGNOSIS — R1032 Left lower quadrant pain: Secondary | ICD-10-CM | POA: Diagnosis not present

## 2021-11-14 DIAGNOSIS — R1012 Left upper quadrant pain: Secondary | ICD-10-CM | POA: Diagnosis not present

## 2021-11-14 DIAGNOSIS — R079 Chest pain, unspecified: Secondary | ICD-10-CM | POA: Diagnosis not present

## 2021-11-14 DIAGNOSIS — R109 Unspecified abdominal pain: Secondary | ICD-10-CM | POA: Diagnosis not present

## 2021-11-14 DIAGNOSIS — R69 Illness, unspecified: Secondary | ICD-10-CM | POA: Diagnosis not present

## 2021-11-14 DIAGNOSIS — I10 Essential (primary) hypertension: Secondary | ICD-10-CM | POA: Diagnosis not present

## 2021-11-14 DIAGNOSIS — R52 Pain, unspecified: Secondary | ICD-10-CM | POA: Diagnosis not present

## 2021-11-15 ENCOUNTER — Ambulatory Visit (HOSPITAL_COMMUNITY): Payer: 59 | Admitting: Hematology

## 2021-11-15 LAB — 5 HIAA, QUANTITATIVE, URINE, 24 HOUR
5-HIAA, Ur: 3.6 mg/L
5-HIAA,Quant.,24 Hr Urine: 3.6 mg/24 hr (ref 0.0–14.9)
Total Volume: 1000

## 2021-11-21 ENCOUNTER — Inpatient Hospital Stay (HOSPITAL_COMMUNITY): Payer: 59 | Admitting: Hematology

## 2021-11-22 ENCOUNTER — Ambulatory Visit: Payer: 59 | Admitting: Surgery

## 2021-12-13 ENCOUNTER — Encounter (HOSPITAL_COMMUNITY): Payer: Self-pay | Admitting: Gastroenterology

## 2021-12-13 NOTE — Progress Notes (Addendum)
Attempted to obtain medical history via telephone, unable to reach at this time. Unable to leave voicemail to return pre surgical testing department's phone call,due to mailbox funot set up.

## 2021-12-20 ENCOUNTER — Ambulatory Visit (HOSPITAL_COMMUNITY): Admission: RE | Admit: 2021-12-20 | Payer: 59 | Source: Home / Self Care | Admitting: Gastroenterology

## 2021-12-20 ENCOUNTER — Encounter (HOSPITAL_COMMUNITY): Payer: Self-pay | Admitting: Certified Registered Nurse Anesthetist

## 2021-12-20 ENCOUNTER — Encounter (HOSPITAL_COMMUNITY): Admission: RE | Payer: Self-pay | Source: Home / Self Care

## 2021-12-20 SURGERY — ULTRASOUND, LOWER GI TRACT, ENDOSCOPIC
Anesthesia: Monitor Anesthesia Care

## 2021-12-20 NOTE — Progress Notes (Signed)
Called patient at [phone] when pt had not shown up. I talked with him on the phone. He was not able to get here til 1000. Somehow he had not remembered appointment. Let dr. Ardis Hughs aware and dr. Ardis Hughs told him to call his office to reschedule. Gave patient the number of office [phone]. Pt agreed to call.

## 2022-02-05 ENCOUNTER — Ambulatory Visit: Payer: 59 | Admitting: Gastroenterology

## 2022-02-18 DIAGNOSIS — G2581 Restless legs syndrome: Secondary | ICD-10-CM | POA: Diagnosis not present

## 2022-02-18 DIAGNOSIS — I10 Essential (primary) hypertension: Secondary | ICD-10-CM | POA: Diagnosis not present

## 2022-02-18 DIAGNOSIS — R202 Paresthesia of skin: Secondary | ICD-10-CM | POA: Diagnosis not present

## 2022-02-18 DIAGNOSIS — R109 Unspecified abdominal pain: Secondary | ICD-10-CM | POA: Diagnosis not present

## 2022-07-03 DIAGNOSIS — R946 Abnormal results of thyroid function studies: Secondary | ICD-10-CM | POA: Diagnosis not present

## 2022-08-28 DIAGNOSIS — K0889 Other specified disorders of teeth and supporting structures: Secondary | ICD-10-CM | POA: Diagnosis not present

## 2022-08-28 DIAGNOSIS — F17210 Nicotine dependence, cigarettes, uncomplicated: Secondary | ICD-10-CM | POA: Diagnosis not present

## 2022-08-28 DIAGNOSIS — K029 Dental caries, unspecified: Secondary | ICD-10-CM | POA: Diagnosis not present

## 2022-08-28 DIAGNOSIS — K047 Periapical abscess without sinus: Secondary | ICD-10-CM | POA: Diagnosis not present

## 2022-08-28 DIAGNOSIS — R220 Localized swelling, mass and lump, head: Secondary | ICD-10-CM | POA: Diagnosis not present

## 2022-10-03 DIAGNOSIS — R911 Solitary pulmonary nodule: Secondary | ICD-10-CM | POA: Diagnosis not present

## 2022-10-03 DIAGNOSIS — D734 Cyst of spleen: Secondary | ICD-10-CM | POA: Diagnosis not present

## 2022-10-03 DIAGNOSIS — N529 Male erectile dysfunction, unspecified: Secondary | ICD-10-CM | POA: Diagnosis not present

## 2022-10-03 DIAGNOSIS — Z0000 Encounter for general adult medical examination without abnormal findings: Secondary | ICD-10-CM | POA: Diagnosis not present

## 2022-10-03 DIAGNOSIS — Z7182 Exercise counseling: Secondary | ICD-10-CM | POA: Diagnosis not present

## 2022-10-03 DIAGNOSIS — D126 Benign neoplasm of colon, unspecified: Secondary | ICD-10-CM | POA: Diagnosis not present

## 2022-10-03 DIAGNOSIS — Z713 Dietary counseling and surveillance: Secondary | ICD-10-CM | POA: Diagnosis not present

## 2022-10-03 DIAGNOSIS — E559 Vitamin D deficiency, unspecified: Secondary | ICD-10-CM | POA: Diagnosis not present

## 2022-10-03 DIAGNOSIS — I10 Essential (primary) hypertension: Secondary | ICD-10-CM | POA: Diagnosis not present

## 2022-10-03 DIAGNOSIS — M5432 Sciatica, left side: Secondary | ICD-10-CM | POA: Diagnosis not present

## 2022-10-03 DIAGNOSIS — Z125 Encounter for screening for malignant neoplasm of prostate: Secondary | ICD-10-CM | POA: Diagnosis not present

## 2022-10-03 DIAGNOSIS — Z6831 Body mass index (BMI) 31.0-31.9, adult: Secondary | ICD-10-CM | POA: Diagnosis not present

## 2022-10-21 DIAGNOSIS — K5900 Constipation, unspecified: Secondary | ICD-10-CM | POA: Diagnosis not present

## 2022-10-21 DIAGNOSIS — M5440 Lumbago with sciatica, unspecified side: Secondary | ICD-10-CM | POA: Diagnosis not present

## 2022-10-21 DIAGNOSIS — M1990 Unspecified osteoarthritis, unspecified site: Secondary | ICD-10-CM | POA: Diagnosis not present

## 2022-10-21 DIAGNOSIS — Z008 Encounter for other general examination: Secondary | ICD-10-CM | POA: Diagnosis not present

## 2022-10-21 DIAGNOSIS — Z8249 Family history of ischemic heart disease and other diseases of the circulatory system: Secondary | ICD-10-CM | POA: Diagnosis not present

## 2022-10-21 DIAGNOSIS — Z5986 Financial insecurity: Secondary | ICD-10-CM | POA: Diagnosis not present

## 2022-10-21 DIAGNOSIS — K219 Gastro-esophageal reflux disease without esophagitis: Secondary | ICD-10-CM | POA: Diagnosis not present

## 2022-10-21 DIAGNOSIS — I10 Essential (primary) hypertension: Secondary | ICD-10-CM | POA: Diagnosis not present

## 2022-10-21 DIAGNOSIS — Z833 Family history of diabetes mellitus: Secondary | ICD-10-CM | POA: Diagnosis not present

## 2022-10-21 DIAGNOSIS — Z91199 Patient's noncompliance with other medical treatment and regimen due to unspecified reason: Secondary | ICD-10-CM | POA: Diagnosis not present

## 2022-10-21 DIAGNOSIS — F17210 Nicotine dependence, cigarettes, uncomplicated: Secondary | ICD-10-CM | POA: Diagnosis not present

## 2022-10-21 DIAGNOSIS — Z809 Family history of malignant neoplasm, unspecified: Secondary | ICD-10-CM | POA: Diagnosis not present

## 2022-10-21 DIAGNOSIS — G2581 Restless legs syndrome: Secondary | ICD-10-CM | POA: Diagnosis not present

## 2023-05-05 IMAGING — CT CT CHEST W/O CM
2 of 4 series · 15 of 36 positions shown, 18 images · non-contrast
Comparison: CT abdomen pelvis dated 07/31/2021 and chest CT dated
09/24/2020.

CLINICAL DATA: Follow-up pulmonary nodule seen on CT of the abdomen
pelvis dated 07/31/2021.



[Series 2: routine chest without · axial · non-contrast · 0.79mm/px · z∈[+1058,+1338]mm · 12 of 166 slices shown, 15 images]
[im 13/166  mediastinal]
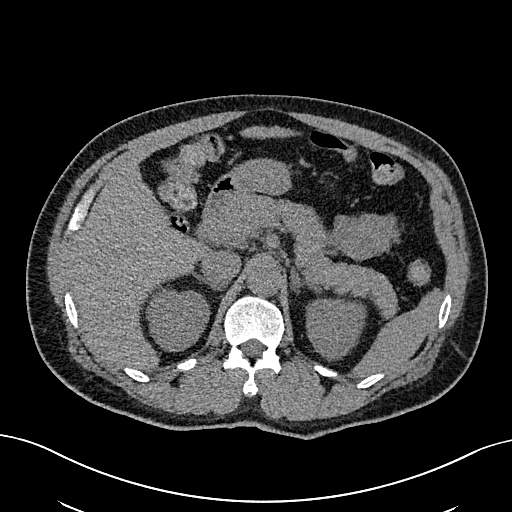
[im 13/166  lung]
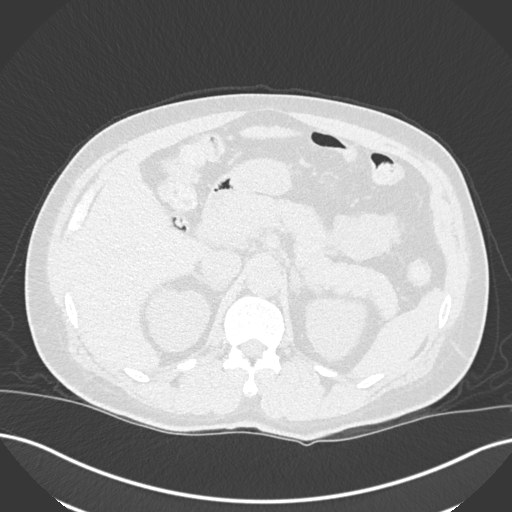
[im 26/166  lung]
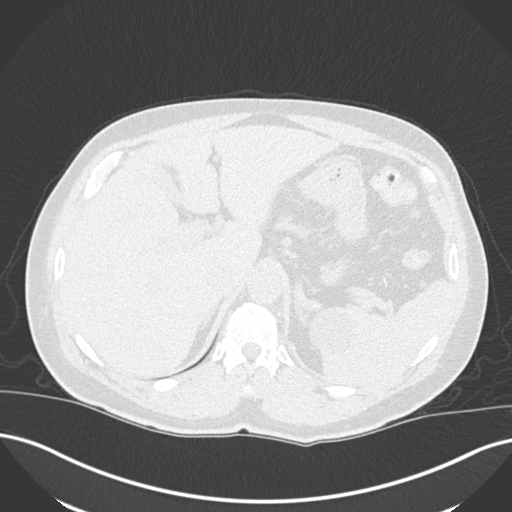
[im 39/166  lung]
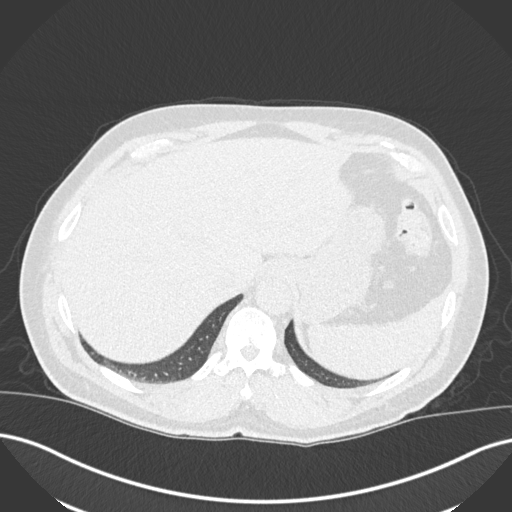
[im 51/166  lung]
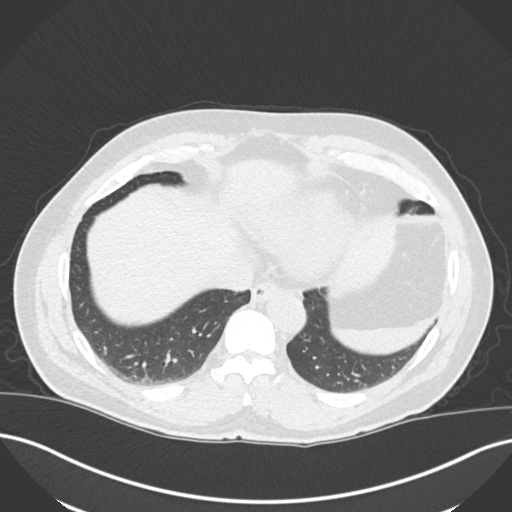
[im 64/166  mediastinal]
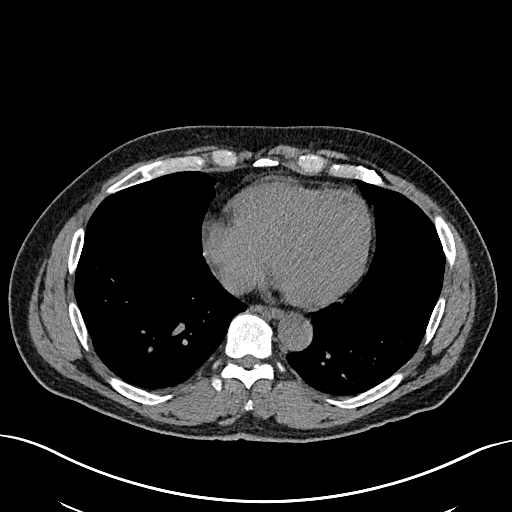
[im 64/166  lung]
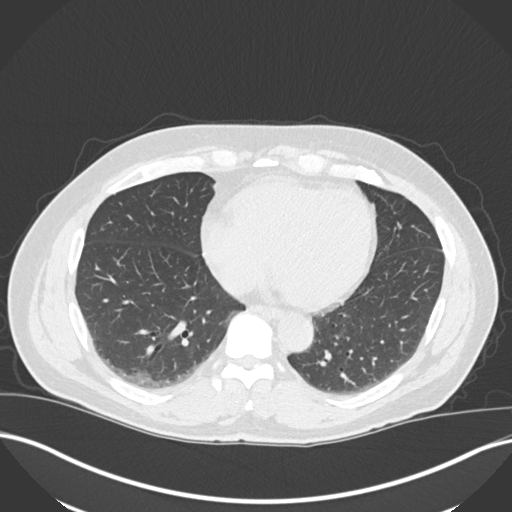
[im 77/166  lung]
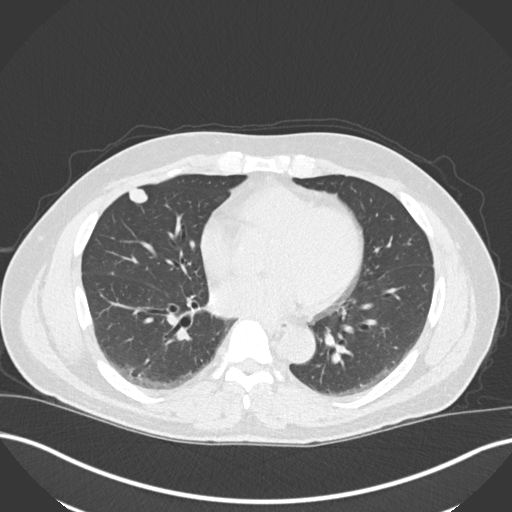
[im 89/166  lung]
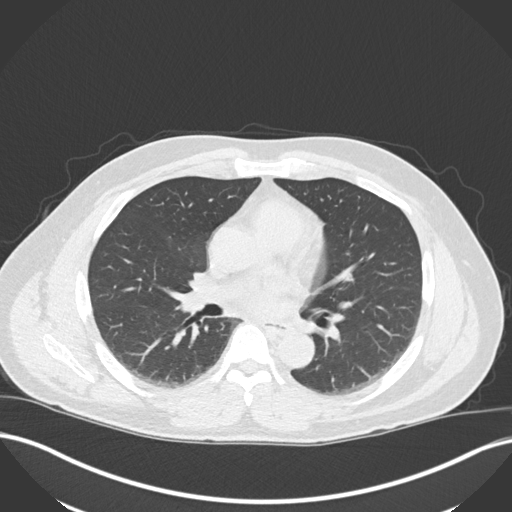
[im 102/166  lung]
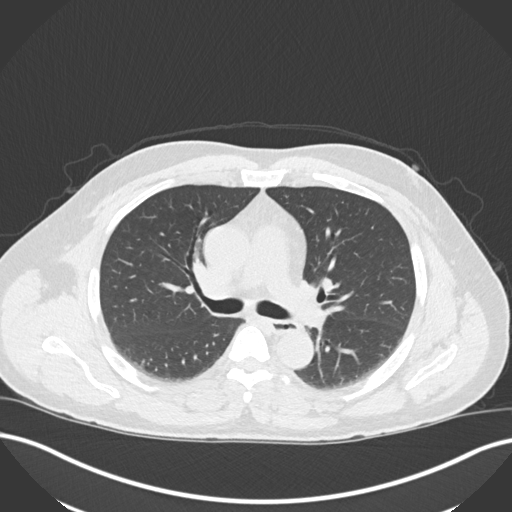
[im 115/166  mediastinal]
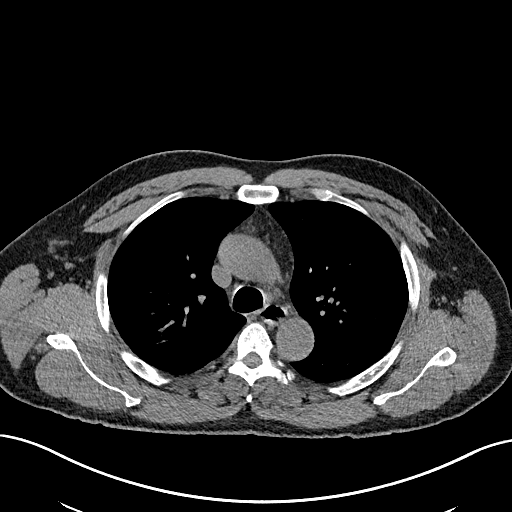
[im 115/166  lung]
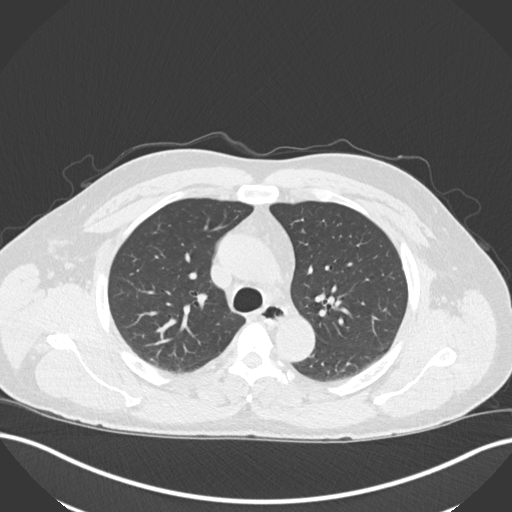
[im 127/166  lung]
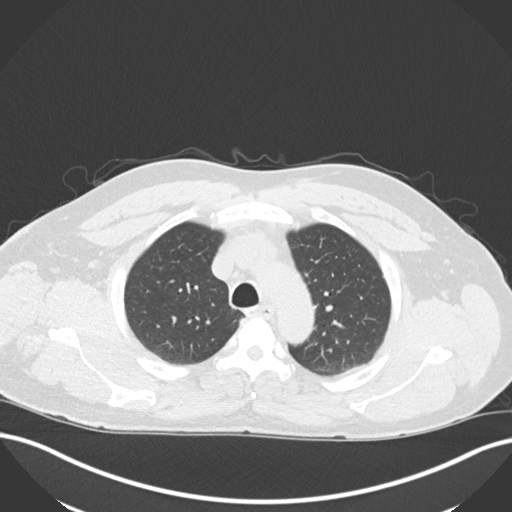
[im 140/166  lung]
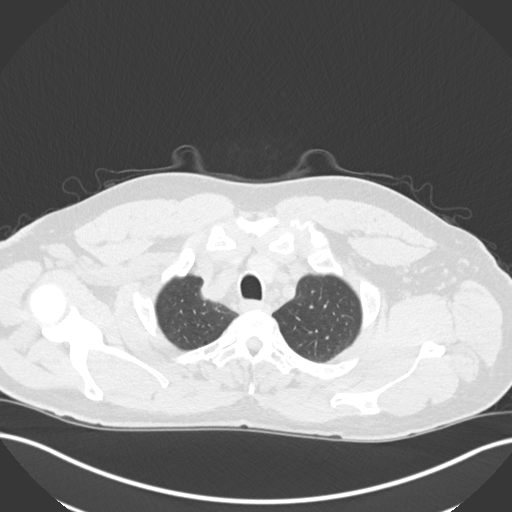
[im 153/166  lung]
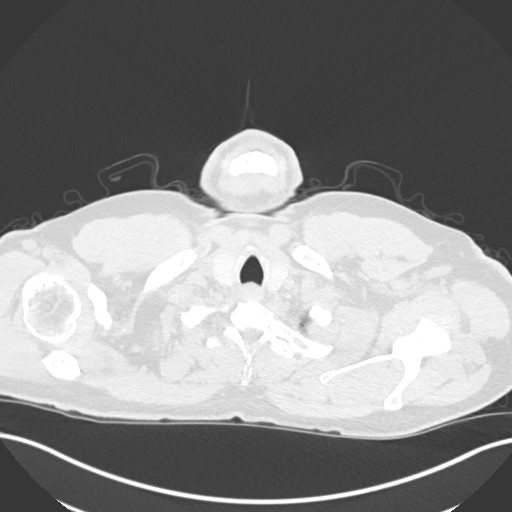

[Series 5: coronal · coronal · 0.70mm/px · 3 of 145 slices shown]
[im 29/145  lung]
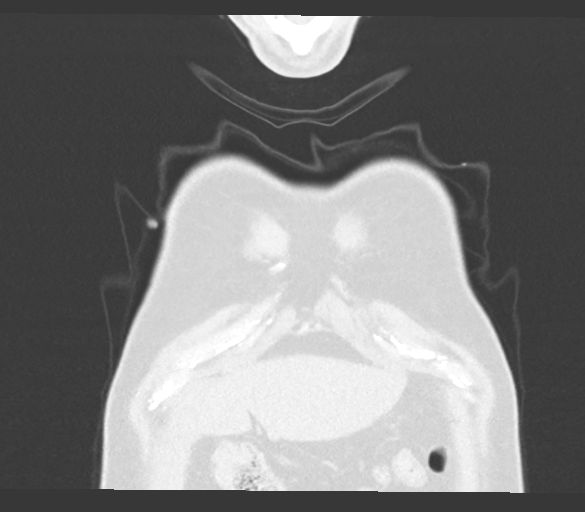
[im 58/145  lung]
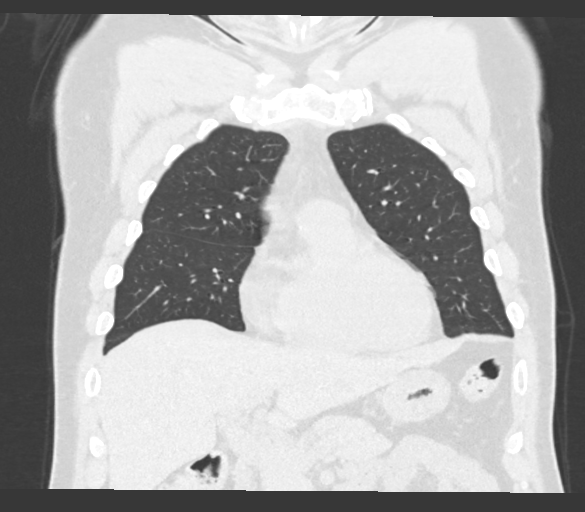
[im 87/145  lung]
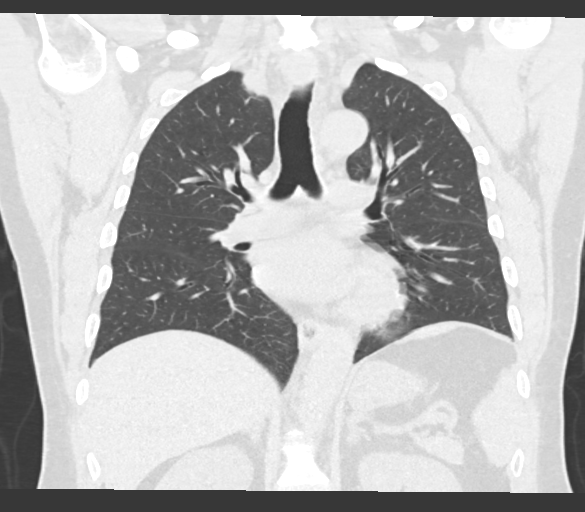

[15 of 36 positions shown; findings below may reference images not displayed]

FINDINGS: Evaluation of this exam is limited in the absence of intravenous
contrast.

Cardiovascular: There is no cardiomegaly or pericardial effusion.
There is coronary vascular calcification involving the left
circumflex artery. The thoracic aorta and central pulmonary arteries
are grossly unremarkable on this noncontrast CT.

Mediastinum/Nodes: No hilar or mediastinal adenopathy. The esophagus
and the thyroid gland are grossly unremarkable. No mediastinal fluid
collection.

Lungs/Pleura: No significant interval change in the 15 mm subpleural
nodule in the right middle lobe dating back to 09/24/2020. No focal
consolidation, pleural effusion, or pneumothorax. The central
airways are patent.

Upper Abdomen: A 3.5 cm hypodense lesion within the spleen has
increased in size since 09/24/2020. This is suboptimally
characterized but likely represents a cyst or hemangioma. Ultrasound
or MRI may provide better characterization.

Musculoskeletal: No chest wall mass or suspicious bone lesions
identified.
IMPRESSION: 1. No acute intrathoracic pathology.
2. No significant interval change in the 15 mm subpleural nodule in
the right middle lobe dating back to 09/24/2020. Further evaluation
with PET-CT or follow-up with CT in 6-12 months recommended.
3. Coronary vascular calcification involving the left circumflex
artery.

## 2023-05-25 DIAGNOSIS — X500XXA Overexertion from strenuous movement or load, initial encounter: Secondary | ICD-10-CM | POA: Diagnosis not present

## 2023-05-25 DIAGNOSIS — Y990 Civilian activity done for income or pay: Secondary | ICD-10-CM | POA: Diagnosis not present

## 2023-05-25 DIAGNOSIS — S39012A Strain of muscle, fascia and tendon of lower back, initial encounter: Secondary | ICD-10-CM | POA: Diagnosis not present

## 2023-05-25 DIAGNOSIS — M545 Low back pain, unspecified: Secondary | ICD-10-CM | POA: Diagnosis not present

## 2023-05-25 DIAGNOSIS — F17210 Nicotine dependence, cigarettes, uncomplicated: Secondary | ICD-10-CM | POA: Diagnosis not present

## 2023-05-25 DIAGNOSIS — I10 Essential (primary) hypertension: Secondary | ICD-10-CM | POA: Diagnosis not present

## 2023-05-25 DIAGNOSIS — Z79899 Other long term (current) drug therapy: Secondary | ICD-10-CM | POA: Diagnosis not present

## 2023-06-08 IMAGING — PT NM PET TUM IMG INITIAL (PI) SKULL BASE T - THIGH
1 series · 2 of 2 positions shown · non-contrast
Comparison: CT chest 09/14/2021, 09/24/2020 and CT abdomen pelvis
10/13/2021.

CLINICAL DATA: Initial treatment strategy for lung nodule.

EXAM:
NUCLEAR MEDICINE PET SKULL BASE TO THIGH
TECHNIQUE: 10.5 mCi F-18 FDG was injected intravenously. Full-ring PET imaging
was performed from the skull base to thigh after the radiotracer. CT
data was obtained and used for attenuation correction and anatomic
localization.
Fasting blood glucose: 89 mg/dl

[Series 1038: results mm oncology reading · 1.0mm · 0.99mm/px · 2 of 2 slices shown]
[im 1/2]
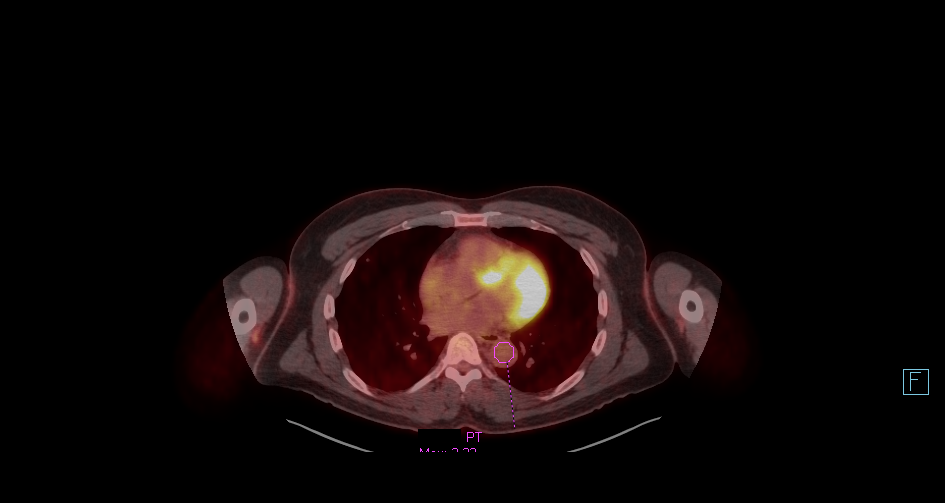
[im 2/2]
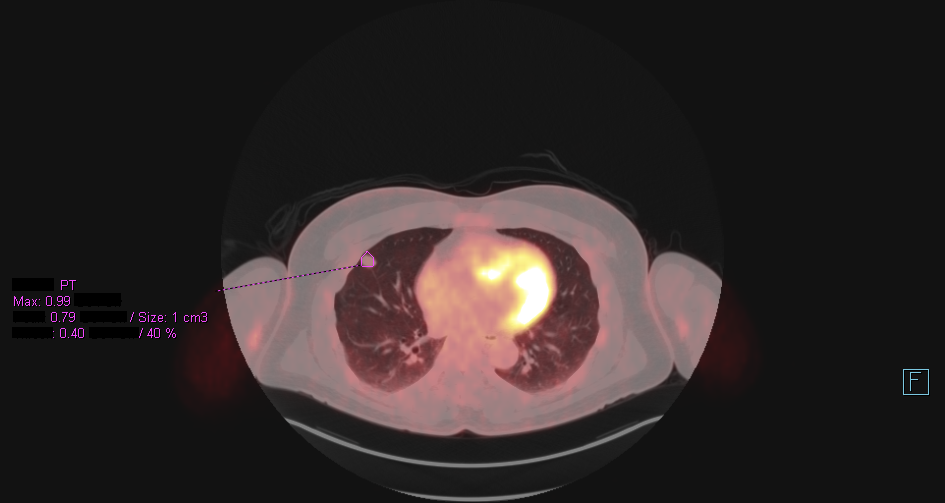

[2 of 2 positions shown; findings below may reference images not displayed]

FINDINGS: Mediastinal blood pool activity: SUV max

Liver activity: SUV max NA

NECK:

No abnormal hypermetabolism.

Incidental CT findings:

None.

CHEST:

No hypermetabolic mediastinal, hilar or axillary lymph nodes. No
abnormal hypermetabolism associated with a smoothly marginated 1.2 x
1.4 cm nodule along the minor fissure (7/65).

Incidental CT findings:

Heart is at the upper limits of normal in size to mildly enlarged.
No pericardial or pleural effusion.

ABDOMEN/PELVIS:

No abnormal hypermetabolism in the liver, adrenal glands, spleen or
pancreas. No hypermetabolic lymph nodes.

Incidental CT findings:

Liver, gallbladder and right adrenal gland are unremarkable. Left
adrenal thickening. No specific follow-up necessary. Kidneys are
unremarkable. 3.6 cm fluid density cyst in the medial spleen, as
before. Patient reportedly status post splenic nodule removal on
10/01/2021. Spleen, pancreas, stomach and bowel are otherwise
unremarkable. Atherosclerotic calcification of the aorta.

SKELETON:

No abnormal hypermetabolism.

Incidental CT findings:

Mild degenerative changes in the spine.
IMPRESSION: 1. Smoothly marginated 13 mm nodule along the minor fissure without
abnormal hypermetabolism. Per report, size is stable from
09/24/2020. Findings strongly favor a benign lesion. Additional
follow-up CT chest without contrast could be performed in 1 year for
2 years of documented stability.
2.  Aortic atherosclerosis ([id number]).
